# Patient Record
Sex: Female | Born: 2005 | Hispanic: Yes | Marital: Single | Smoking: Never smoker
Health system: Southern US, Community
[De-identification: ages and names within clinical notes are randomized; demographics above are authoritative.]

## PROBLEM LIST (undated history)

## (undated) DIAGNOSIS — F32A Depression, unspecified: Secondary | ICD-10-CM

## (undated) DIAGNOSIS — J45909 Unspecified asthma, uncomplicated: Secondary | ICD-10-CM

## (undated) HISTORY — PX: NO PAST SURGERIES: SHX2092

## (undated) HISTORY — DX: Depression, unspecified: F32.A

---
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
## 2023-01-02 ENCOUNTER — Ambulatory Visit (INDEPENDENT_AMBULATORY_CARE_PROVIDER_SITE_OTHER): Payer: Medicaid Other

## 2023-01-02 ENCOUNTER — Ambulatory Visit (INDEPENDENT_AMBULATORY_CARE_PROVIDER_SITE_OTHER): Payer: Medicaid Other | Admitting: Podiatry

## 2023-01-02 ENCOUNTER — Encounter: Payer: Self-pay | Admitting: Podiatry

## 2023-01-02 DIAGNOSIS — M7752 Other enthesopathy of left foot: Secondary | ICD-10-CM

## 2023-01-02 DIAGNOSIS — Q6689 Other  specified congenital deformities of feet: Secondary | ICD-10-CM

## 2023-01-02 NOTE — Progress Notes (Signed)
  Subjective:  Patient ID: Alexis Wilcox, female    DOB: March 13, 2006,  MRN: [id number] HPI Chief Complaint  Patient presents with   Ankle Pain    Lateral ankle right - aching, sometimes severe pain, burning x few months, no injury, radiates across the top of ankle if on it for awhile, cramping in the ankle and top of foot at night, tried massage cream and Ibuprofen/Tylenol   New Patient (Initial Visit)    17 y.o. female presents with the above complaint.   ROS: Denies fever chills nausea vomit muscle aches pains calf pain back pain chest pain shortness of breath.  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  She states that the foot feels like it is rolling to the outside and is very painful.  She is afraid is going to affect her ability to perform her job duties as well as exercise and assist in her general good health.  Past Medical History:  Diagnosis Date   Asthma    Depression    No past surgical history on file.  Current Outpatient Medications:    cephALEXin (KEFLEX) 500 MG capsule, Take 1 capsule (500 mg total) by mouth 4 (four) times daily for 7 days., Disp: 28 capsule, Rfl: 0  No Known Allergies Review of Systems Objective:  There were no vitals filed for this visit.  General: Well developed, nourished, in no acute distress, alert and oriented x3   Dermatological: Skin is warm, dry and supple bilateral. Nails x 10 are well maintained; remaining integument appears unremarkable at this time. There are no open sores, no preulcerative lesions, no rash or signs of infection present.  Vascular: Dorsalis Pedis artery and Posterior Tibial artery pedal pulses are 2/4 bilateral with immedate capillary fill time. Pedal hair growth present. No varicosities and no lower extremity edema  present bilateral.   Neruologic: Grossly intact via light touch bilateral. Vibratory intact via tuning fork bilateral. Protective threshold with Semmes Wienstein monofilament intact to all pedal sites bilateral. Patellar and Achilles deep tendon reflexes 2+ bilateral. No Babinski or clonus noted bilateral.   Musculoskeletal: No gross boney pedal deformities bilateral.  She has pain on attempted range of motion of the subtalar joint of the left foot she got full ankle joint range of motion and midfoot range of motion.  She has pain on palpation of the sinus tarsi.   Gait: Unassisted, Nonantalgic.    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  Plan: Requesting MRI for differential diagnosis and surgical planning for the correction of this coalition.  It is important to know at this point as to whether or not it is a fibrous coalition cartilaginous coalition or osseous coalition.     Asiana Benninger T. [place], [place]

## 2023-01-08 ENCOUNTER — Other Ambulatory Visit: Payer: Medicaid Other

## 2023-01-09 DIAGNOSIS — Z20822 Contact with and (suspected) exposure to covid-19: Secondary | ICD-10-CM | POA: Diagnosis not present

## 2023-01-09 DIAGNOSIS — A084 Viral intestinal infection, unspecified: Secondary | ICD-10-CM | POA: Diagnosis not present

## 2023-01-16 ENCOUNTER — Telehealth: Payer: Self-pay

## 2023-01-16 NOTE — Telephone Encounter (Signed)
Pt called triage line stating "the hospital told me to call this number". Tried returning patient's call to get more information, LVM for pt to return call

## 2023-01-17 ENCOUNTER — Telehealth: Payer: Self-pay | Admitting: Podiatry

## 2023-01-17 NOTE — Telephone Encounter (Signed)
Patient called, but was difficult to understand on voicemail.    It sounds like she was saying Dr. Al Corpus referred her somewhere else, but she was having issues with her Medicaid at the time.  She is requesting it be sent again.  Did not specify if same place or different facility.    Her callback number:  [phone]

## 2023-01-18 ENCOUNTER — Encounter: Payer: Self-pay | Admitting: Family Medicine

## 2023-01-18 ENCOUNTER — Ambulatory Visit (LOCAL_COMMUNITY_HEALTH_CENTER): Payer: Medicaid Other | Admitting: Family Medicine

## 2023-01-18 VITALS — BP 112/74 | HR 74 | Ht 65.0 in | Wt 199.0 lb

## 2023-01-18 DIAGNOSIS — Z309 Encounter for contraceptive management, unspecified: Secondary | ICD-10-CM

## 2023-01-18 DIAGNOSIS — Z975 Presence of (intrauterine) contraceptive device: Secondary | ICD-10-CM | POA: Diagnosis not present

## 2023-01-18 DIAGNOSIS — N939 Abnormal uterine and vaginal bleeding, unspecified: Secondary | ICD-10-CM

## 2023-01-18 MED ORDER — NORGESTIM-ETH ESTRAD TRIPHASIC 0.18/0.215/0.25 MG-25 MCG PO TABS
1.0000 | ORAL_TABLET | Freq: Every day | ORAL | 2 refills | Status: DC
Start: 2023-01-18 — End: 2023-01-31

## 2023-01-18 NOTE — Progress Notes (Signed)
Pt here for acute visit, condoms given. Gaspar Garbe, RN

## 2023-01-18 NOTE — Progress Notes (Signed)
   [facility] Problem [facility]  [facility]  Subjective:  Alexis Wilcox is a 17 y.o. being seen today for   Chief Complaint  Patient presents with   Contraception    HPI  Nexplanon placed in Feb 2024, since then reports continuous bleeding. States the bleeding stopped for about 2 weeks, and then started back up again. Notices that she feels irritable and has cravings and gained weight (particularly around face) and believes this is due to Nexplanon. I reviewed that we have ways to control the bleeding but adding OCPs may not improve the weight or mood. Patient decides she would still like to try OCPs for now, as not getting pregnant is a priority.   Health Maintenance Due  Topic Date Due   CHLAMYDIA SCREENING  Never done   HPV VACCINES (1 - 3-dose series) Never done   HIV Screening  Never done   Hepatitis C Screening  Never done   COVID-19 Vaccine (1 - 2023-24 season) Never done   DTaP/Tdap/Td (1 - Tdap) Never done   INFLUENZA VACCINE  01/03/2023    Review of Systems  Constitutional:        Weight gain  Psychiatric/Behavioral:         Irritable    The following portions of the patient's history were reviewed and updated as appropriate: allergies, current medications, past family history, past medical history, past social history, past surgical history and problem list. Problem list updated.   See flowsheet for other program required questions.  Objective:   Vitals:   01/18/23 0825  BP: 112/74  Pulse: 74  Weight: 199 lb (90.3 kg)  Height: 5\' 5"  (1.651 m)    Physical Exam  Deferred   Assessment and Plan:  Alexis Wilcox is a 17 y.o. female presenting to the [facility] Department for a Women's Health problem visit  1. Nexplanon in place -desires to keep device  2. Vaginal bleeding -counseled to start OCPs, and take for 3 months -reviewed that if symptoms do not improve, please RTC for  removal of device. Reviewed that there are other options for [facility], including IUD  - Norgestimate-Ethinyl Estradiol Triphasic (TRI-LO-SPRINTEC) 0.18/0.215/0.25 MG-25 MCG tab; Take 1 tablet by mouth daily.  Dispense: 28 tablet; Refill: 2     Return if symptoms worsen or fail to improve.  No future appointments. Total time spent 20 minutes  Lenice Llamas, [place]

## 2023-01-30 ENCOUNTER — Telehealth: Payer: Self-pay | Admitting: Podiatry

## 2023-01-30 NOTE — Telephone Encounter (Signed)
Pt called and was wanting to make sure the referral was sent for pt to have her mri rescheduled.

## 2023-01-31 ENCOUNTER — Ambulatory Visit (INDEPENDENT_AMBULATORY_CARE_PROVIDER_SITE_OTHER): Payer: BC Managed Care – PPO | Admitting: Urology

## 2023-01-31 ENCOUNTER — Encounter: Payer: Self-pay | Admitting: Urology

## 2023-01-31 VITALS — BP 111/79 | HR 86 | Ht 65.0 in | Wt 202.4 lb

## 2023-01-31 DIAGNOSIS — R3129 Other microscopic hematuria: Secondary | ICD-10-CM

## 2023-01-31 DIAGNOSIS — R109 Unspecified abdominal pain: Secondary | ICD-10-CM | POA: Diagnosis not present

## 2023-01-31 DIAGNOSIS — Z87440 Personal history of urinary (tract) infections: Secondary | ICD-10-CM

## 2023-01-31 LAB — URINALYSIS, COMPLETE
Bilirubin, UA: NEGATIVE
Glucose, UA: NEGATIVE
Ketones, UA: NEGATIVE
Leukocytes,UA: NEGATIVE
Nitrite, UA: NEGATIVE
Protein,UA: NEGATIVE
Specific Gravity, UA: 1.025 (ref 1.005–1.030)
Urobilinogen, Ur: 0.2 mg/dL (ref 0.2–1.0)
pH, UA: 6 (ref 5.0–7.5)

## 2023-01-31 LAB — MICROSCOPIC EXAMINATION

## 2023-01-31 NOTE — Progress Notes (Signed)
Marcelle Overlie Plume,acting as a scribe for Vanna Scotland, MD.,have documented all relevant documentation on the behalf of Vanna Scotland, MD,as directed by  Vanna Scotland, MD while in the presence of Vanna Scotland, MD.  01/31/2023 9:34 AM   Marvis Moeller Jolaine Artist 15-Nov-2005 [id number]  Referring provider: No referring provider defined for this encounter.  Chief Complaint  Patient presents with   Establish Care   Hematuria    HPI: 17 year-old female who presents today for follow up of a possible stone event.   She was seen and evaluated in the emergency room on 12/31/2022. At that point, she was having some right sided flank pain intermittently for several weeks along with some chronic intermittent vaginal bleeding. Her urinalysis showed "large blood on dip but none microscopically." She had a few WBC and they treated her for presumed urinary tract infection, although the culture was negative, prescribed Keflex. She had a CT stone protocol on 12/31/2022 that showed no stones, no GU abnormality, but a few prominent mesenteric lymph nodes, especially in the right hemi-abdomen up to 7 mm, possibly consistent with mesenteric adenitis.   Today, she reports that the pain initially started in her lower back and then moved to her abdomen. Despite antibiotic treatment, she continues to experience pain. She also mentions a history of a miscarriage in November, after which she has been prone to frequent infections and UTIs, although recent urine cultures have been negative.  Urinalysis today has 6-10 WBC, 3-10 RBC, many bacteria, but her dip is bland.     PMH: Past Medical History:  Diagnosis Date   Asthma    Depression     Surgical History: Past Surgical History:  Procedure Laterality Date   NO PAST SURGERIES      Home Medications:  Allergies as of 01/31/2023   No Known Allergies      Medication List        Accurate as of January 31, 2023  9:34 AM. If you have any questions,  ask your nurse or doctor.          STOP taking these medications    Norgestimate-Ethinyl Estradiol Triphasic 0.18/0.215/0.25 MG-25 MCG tab Commonly known as: Tri-Lo-Sprintec Stopped by: Vanna Scotland       TAKE these medications    NEXPLANON [place] Inject into the skin.        Family History: Family History  Problem Relation Age of Onset   Diabetes Mother    Diabetes Maternal Grandmother     Social History:  reports that she has never smoked. She has never used smokeless tobacco. She reports current alcohol use. She reports current drug use. Frequency: 4.00 times per week. Drug: Marijuana.   Physical Exam: BP 111/79   Pulse 86   Ht 5\' 5"  (1.651 m)   Wt 202 lb 6 oz (91.8 kg)   LMP 01/11/2023 (Exact Date)   BMI 33.68 kg/m   Constitutional:  Alert and oriented, No acute distress. HEENT: [place] AT, moist mucus membranes.  Trachea midline, no masses. Neurologic: Grossly intact, no focal deficits, moving all 4 extremities. Psychiatric: Normal mood and affect.   Pertinent Imaging:     EXAM: CT ABDOMEN AND PELVIS WITHOUT CONTRAST  TECHNIQUE: Multidetector CT imaging of the abdomen and pelvis was performed following the standard protocol without IV contrast.  RADIATION DOSE REDUCTION: This exam was performed according to the departmental dose-optimization program which includes automated exposure control, adjustment of the mA and/or kV according to patient size and/or  use of iterative reconstruction technique.  COMPARISON:  Abdominal radiograph dated 08/13/2015.  FINDINGS: Lower chest: No acute abnormality.  Hepatobiliary: No focal liver abnormality is seen. No gallstones, gallbladder wall thickening, or biliary dilatation.  Pancreas: Unremarkable. No pancreatic ductal dilatation or surrounding inflammatory changes.  Spleen: Normal in size without focal abnormality.  Adrenals/Urinary Tract: Adrenal glands are unremarkable. Kidneys are normal, without renal  calculi, focal lesion, or hydronephrosis. Bladder is unremarkable.  Stomach/Bowel: Stomach is within normal limits. Appendix appears normal. No evidence of bowel wall thickening, distention, or inflammatory changes.  Vascular/Lymphatic: A few prominent mesenteric lymph nodes in the right hemiabdomen are noted, measuring up to 7 mm in short axis. No significant vascular findings are present.  Reproductive: Uterus and bilateral adnexa are unremarkable.  Other: No abdominal wall hernia or abnormality. No abdominopelvic ascites.  Musculoskeletal: No acute or significant osseous findings.  IMPRESSION: 1. A few prominent mesenteric lymph nodes in the right hemiabdomen are nonspecific but could reflect mesenteric adenitis.   Electronically Signed By: Romona Curls M.D. On: 12/31/2022 16:54  Results for orders placed or performed in visit on 01/31/23  Microscopic Examination   Urine  Result Value Ref Range   WBC, UA 6-10 (A) 0 - 5 /hpf   RBC, Urine 3-10 (A) 0 - 2 /hpf   Epithelial Cells (non renal) 0-10 0 - 10 /hpf   Bacteria, UA Many (A) None seen/Few  Urinalysis, Complete  Result Value Ref Range   Specific Gravity, UA 1.025 1.005 - 1.030   pH, UA 6.0 5.0 - 7.5   Color, UA Yellow Yellow   Appearance Ur Hazy (A) Clear   Leukocytes,UA Negative Negative   Protein,UA Negative Negative/Trace   Glucose, UA Negative Negative   Ketones, UA Negative Negative   RBC, UA 1+ (A) Negative   Bilirubin, UA Negative Negative   Urobilinogen, Ur 0.2 0.2 - 1.0 mg/dL   Nitrite, UA Negative Negative   Microscopic Examination See below:      Assessment & Plan:    1. Abdominal pain - Likely related to mesenteric adenitis as indicated by CT findings; no evidence of GU pathology or stones -Nature of pain is also not consistent with renal origin - Continue to monitor symptoms and follow up with primary care or gastroenterology for further evaluation.  2. Recurrent UTI - Recent urine  cultures have been negative, questioning the diagnosis of recurrent UTIs. - Encourage good hygiene practices: drink plenty of water, urinate after intercourse, and wipe front to back. - Urine sample today will be sent for culture to rule out current infection.  Return if symptoms worsen or fail to improve.  I have reviewed the above documentation for accuracy and completeness, and I agree with the above.   Vanna Scotland, MD    [facility] [address] [place], [place] [address] [phone]

## 2023-02-03 ENCOUNTER — Ambulatory Visit
Admission: RE | Admit: 2023-02-03 | Discharge: 2023-02-03 | Disposition: A | Discharge status: Home / Self Care | Payer: Medicaid Other | Source: Ambulatory Visit | Attending: Podiatry | Admitting: Podiatry

## 2023-02-03 DIAGNOSIS — Q6689 Other  specified congenital deformities of feet: Secondary | ICD-10-CM

## 2023-02-07 LAB — CULTURE, URINE COMPREHENSIVE

## 2023-02-12 ENCOUNTER — Telehealth: Payer: Self-pay | Admitting: Podiatry

## 2023-02-12 NOTE — Telephone Encounter (Signed)
Patient is calling to see if she can get some pain medication until get MRI results.

## 2023-02-15 ENCOUNTER — Other Ambulatory Visit: Payer: Medicaid Other

## 2023-02-25 ENCOUNTER — Telehealth: Payer: Self-pay

## 2023-02-25 NOTE — Telephone Encounter (Signed)
-----   Message from Lottie Rater sent at 02/25/2023  8:38 AM EDT -----  ----- Message ----- From: Elinor Parkinson, DPM Sent: 02/20/2023  12:55 PM EDT To: Kristian Covey, PMAC  Send for CT rear foot to evaluate STJ coalition and how much of the bone is involved.  Left Rear foot. Let patient know why we are doing this.

## 2023-02-25 NOTE — Telephone Encounter (Signed)
Called patient to discuss MRI result and the need for CT scan. Left message to please call back

## 2023-03-01 ENCOUNTER — Other Ambulatory Visit: Payer: Self-pay

## 2023-03-01 DIAGNOSIS — Q6689 Other  specified congenital deformities of feet: Secondary | ICD-10-CM

## 2023-03-04 ENCOUNTER — Encounter: Payer: Self-pay | Admitting: Podiatry

## 2023-03-07 DIAGNOSIS — Z03818 Encounter for observation for suspected exposure to other biological agents ruled out: Secondary | ICD-10-CM | POA: Diagnosis not present

## 2023-03-07 DIAGNOSIS — B9689 Other specified bacterial agents as the cause of diseases classified elsewhere: Secondary | ICD-10-CM | POA: Diagnosis not present

## 2023-03-07 DIAGNOSIS — J019 Acute sinusitis, unspecified: Secondary | ICD-10-CM | POA: Diagnosis not present

## 2023-03-08 ENCOUNTER — Inpatient Hospital Stay: Admission: RE | Admit: 2023-03-08 | Payer: BC Managed Care – PPO | Source: Ambulatory Visit

## 2023-03-11 ENCOUNTER — Other Ambulatory Visit: Payer: Self-pay

## 2023-03-11 ENCOUNTER — Other Ambulatory Visit
Admission: RE | Admit: 2023-03-11 | Discharge: 2023-03-11 | Disposition: A | Discharge status: Home / Self Care | Payer: BC Managed Care – PPO | Source: Ambulatory Visit | Attending: Student | Admitting: Student

## 2023-03-11 ENCOUNTER — Ambulatory Visit: Payer: BC Managed Care – PPO | Admitting: Podiatry

## 2023-03-11 ENCOUNTER — Emergency Department: Payer: BC Managed Care – PPO

## 2023-03-11 ENCOUNTER — Emergency Department
Admission: EM | Admit: 2023-03-11 | Discharge: 2023-03-11 | Disposition: A | Discharge status: Home / Self Care | Payer: BC Managed Care – PPO | Attending: Emergency Medicine | Admitting: Emergency Medicine

## 2023-03-11 DIAGNOSIS — R0789 Other chest pain: Secondary | ICD-10-CM | POA: Insufficient documentation

## 2023-03-11 DIAGNOSIS — R0602 Shortness of breath: Secondary | ICD-10-CM | POA: Diagnosis not present

## 2023-03-11 DIAGNOSIS — R1011 Right upper quadrant pain: Secondary | ICD-10-CM | POA: Diagnosis not present

## 2023-03-11 DIAGNOSIS — J45909 Unspecified asthma, uncomplicated: Secondary | ICD-10-CM | POA: Diagnosis not present

## 2023-03-11 DIAGNOSIS — R112 Nausea with vomiting, unspecified: Secondary | ICD-10-CM | POA: Diagnosis not present

## 2023-03-11 LAB — CBC
HCT: 41 % (ref 36.0–46.0)
Hemoglobin: 13.3 g/dL (ref 12.0–15.0)
MCH: 26.9 pg (ref 26.0–34.0)
MCHC: 32.4 g/dL (ref 30.0–36.0)
MCV: 83 fL (ref 80.0–100.0)
Platelets: 216 10*3/uL (ref 150–400)
RBC: 4.94 MIL/uL (ref 3.87–5.11)
RDW: 12.5 % (ref 11.5–15.5)
WBC: 8.6 10*3/uL (ref 4.0–10.5)
nRBC: 0 % (ref 0.0–0.2)

## 2023-03-11 LAB — D-DIMER, QUANTITATIVE: D-Dimer, Quant: 0.63 ug{FEU}/mL — ABNORMAL HIGH (ref 0.00–0.50)

## 2023-03-11 LAB — BASIC METABOLIC PANEL
Anion gap: 12 (ref 5–15)
BUN: 17 mg/dL (ref 6–20)
CO2: 23 mmol/L (ref 22–32)
Calcium: 9.2 mg/dL (ref 8.9–10.3)
Chloride: 106 mmol/L (ref 98–111)
Creatinine, Ser: 0.61 mg/dL (ref 0.44–1.00)
GFR, Estimated: >60 mL/min (ref >60)
Glucose, Bld: 95 mg/dL (ref 70–99)
Potassium: 4.6 mmol/L (ref 3.5–5.1)
Sodium: 141 mmol/L (ref 135–145)

## 2023-03-11 LAB — TROPONIN I (HIGH SENSITIVITY): Troponin I (High Sensitivity): <2 ng/L (ref <18)

## 2023-03-11 MED ORDER — IOHEXOL 350 MG/ML SOLN
75.0000 mL | Freq: Once | INTRAVENOUS | Status: AC | PRN
  Administered 2023-03-11: 75 mL via INTRAVENOUS

## 2023-03-11 NOTE — ED Triage Notes (Signed)
Pt to ED for chest discomfort and shob for the past few days. Recently sick. Sent from [facility] for elevated d dimer.

## 2023-03-11 NOTE — ED Notes (Signed)
See triage note  Presents with family from [facility]   States she has been having chest discomfort off and on for a while  States the pain increases with inspiration   No fever or cough

## 2023-03-11 NOTE — Discharge Instructions (Signed)
Return to the ER for new, worsening, or persistent severe chest pain difficulty breathing, or any other new or worsening symptoms that concern you.

## 2023-03-11 NOTE — ED Notes (Signed)
States had chest xray PTA

## 2023-03-11 NOTE — ED Provider Notes (Signed)
[facility] Provider Note    Event Date/Time   First MD Initiated Contact with Patient 03/11/23 1641     (approximate)   History   Chest Pain and Shortness of Breath   HPI  Sherle Mello Isolde Skaff is a 17 y.o. female with a history of asthma and depression who presents with chest pain and shortness of breath.  The patient states that the symptoms have been present for some time and she has previously been diagnosed with chest wall pain.  She states that she will get sharp chest pain that moves around to different areas of her chest and sometimes will be associated with shortness of breath.  It is intermittent and has been present for at least a few months.  Last week she started to have cough and congestion and was treated for a respiratory infection with prednisone and an antibiotic.  She states that those symptoms have improved, however now she is having the intermittent chest pain and shortness of breath more severely.  She denies any cough or fever at this time.  She has no leg swelling.  She has no prior history of blood clots.  She is on Nexplanon.  She does not smoke.  I reviewed the past medical records.  The patient's most recent outpatient encounter was on 8/29 with urology for follow-up of ureteral stone.  She was seen at the walk-in clinic today for the current symptoms and D-dimer was elevated.   Physical Exam   Triage Vital Signs: ED Triage Vitals  Encounter Vitals Group     BP 03/11/23 1326 (!) 139/91     Systolic BP Percentile --      Diastolic BP Percentile --      Pulse Rate 03/11/23 1326 86     Resp 03/11/23 1326 18     Temp 03/11/23 1326 98.1 F (36.7 C)     Temp src --      SpO2 03/11/23 1326 99 %     Weight 03/11/23 1318 205 lb (93 kg)     Height 03/11/23 1318 5\' 5"  (1.651 m)     Head Circumference --      Peak Flow --      Pain Score 03/11/23 1318 7     Pain Loc --      Pain Education --      Exclude from Growth Chart --      Most recent vital signs: Vitals:   03/11/23 1732 03/11/23 1935  BP: 130/88 128/82  Pulse: 80 78  Resp: 16 16  Temp: 98 F (36.7 C)   SpO2: 99% 100%     General: Awake, no distress.  CV:  Good peripheral perfusion.  Resp:  Normal effort.  Lungs CTAB. Abd:  No distention.  Other:  No peripheral edema.   ED Results / Procedures / Treatments   Labs (all labs ordered are listed, but only abnormal results are displayed) Labs Reviewed  BASIC METABOLIC PANEL  CBC  POC URINE PREG, ED  TROPONIN I (HIGH SENSITIVITY)     EKG  ED ECG REPORT I, Dionne Bucy, the attending physician, personally viewed and interpreted this ECG.  Date: 03/11/2023 EKG Time: 1322 Rate: 81 Rhythm: normal sinus rhythm QRS Axis: normal Intervals: normal ST/T Wave abnormalities: normal Narrative Interpretation: no evidence of acute ischemia    RADIOLOGY  CT angio chest: I independently viewed and interpreted the images; there are no pulmonary artery occlusions.  Radiology report indicates no evidence  of acute PE.  PROCEDURES:  Critical Care performed: No  Procedures   MEDICATIONS ORDERED IN ED: Medications  iohexol (OMNIPAQUE) 350 MG/ML injection 75 mL (75 mLs Intravenous Contrast Given 03/11/23 1759)     IMPRESSION / MDM / ASSESSMENT AND PLAN / ED COURSE  I reviewed the triage vital signs and the nursing notes.  17 year old female with PMH as noted above presents with intermittent chest pain and shortness of breath which appears to be an exacerbation of chronic symptoms that she has had for at least several months and been worked up for previously.  She had respiratory infection last week which has cleared up, but now she is having an increase in chest pain and shortness of breath.  She was seen at the walk-in clinic today and had an elevated D-dimer so was sent to the ED for further evaluation.  Differential diagnosis includes, but is not limited to, muscular skeletal chest  wall pain, GERD, other benign etiology, PE.  There is no evidence of ACS.  Given that her symptoms are somewhat chronic my clinical suspicion for PE is low but with the elevated D-dimer we will obtain a CT angio for further evaluation.  Patient's presentation is most consistent with acute complicated illness / injury requiring diagnostic workup.  The patient is on the cardiac monitor to evaluate for evidence of arrhythmia and/or significant heart rate changes.  ----------------------------------------- 11:45 PM on 03/11/2023 -----------------------------------------  Labs are unremarkable here with negative troponin and no leukocytosis on the CBC.  CT chest shows no evidence of PE.  The patient is stable for discharge home.  I counseled her on the results of the workup and on return precautions; she expressed understanding.   FINAL CLINICAL IMPRESSION(S) / ED DIAGNOSES   Final diagnoses:  Chest wall pain     Rx / DC Orders   ED Discharge Orders     None        Note:  This document was prepared using Dragon voice recognition software and may include unintentional dictation errors.    Dionne Bucy, MD 03/11/23 [phone]

## 2023-03-11 NOTE — ED Notes (Signed)
Unsuccessful lab attempt. Lab contacted

## 2023-03-14 DIAGNOSIS — Z309 Encounter for contraceptive management, unspecified: Secondary | ICD-10-CM | POA: Diagnosis not present

## 2023-03-14 DIAGNOSIS — E669 Obesity, unspecified: Secondary | ICD-10-CM | POA: Diagnosis not present

## 2023-03-14 DIAGNOSIS — Z1389 Encounter for screening for other disorder: Secondary | ICD-10-CM | POA: Diagnosis not present

## 2023-03-14 DIAGNOSIS — Z1331 Encounter for screening for depression: Secondary | ICD-10-CM | POA: Diagnosis not present

## 2023-03-14 DIAGNOSIS — Z0000 Encounter for general adult medical examination without abnormal findings: Secondary | ICD-10-CM | POA: Diagnosis not present

## 2023-03-14 DIAGNOSIS — F418 Other specified anxiety disorders: Secondary | ICD-10-CM | POA: Diagnosis not present

## 2023-03-14 DIAGNOSIS — M25572 Pain in left ankle and joints of left foot: Secondary | ICD-10-CM | POA: Diagnosis not present

## 2023-03-14 DIAGNOSIS — J4540 Moderate persistent asthma, uncomplicated: Secondary | ICD-10-CM | POA: Diagnosis not present

## 2023-03-18 DIAGNOSIS — M25572 Pain in left ankle and joints of left foot: Secondary | ICD-10-CM | POA: Diagnosis not present

## 2023-03-20 DIAGNOSIS — F39 Unspecified mood [affective] disorder: Secondary | ICD-10-CM | POA: Diagnosis not present

## 2023-04-03 ENCOUNTER — Ambulatory Visit: Payer: BC Managed Care – PPO | Admitting: Podiatry

## 2023-04-25 DIAGNOSIS — R350 Frequency of micturition: Secondary | ICD-10-CM | POA: Diagnosis not present

## 2023-04-25 DIAGNOSIS — J309 Allergic rhinitis, unspecified: Secondary | ICD-10-CM | POA: Diagnosis not present

## 2023-04-25 DIAGNOSIS — N390 Urinary tract infection, site not specified: Secondary | ICD-10-CM | POA: Diagnosis not present

## 2023-05-20 ENCOUNTER — Ambulatory Visit
Admission: RE | Admit: 2023-05-20 | Discharge: 2023-05-20 | Disposition: A | Discharge status: Home / Self Care | Payer: Worker's Comp, Other unspecified | Source: Ambulatory Visit | Attending: Physician Assistant | Admitting: Physician Assistant

## 2023-05-20 ENCOUNTER — Other Ambulatory Visit: Payer: Self-pay | Admitting: Physician Assistant

## 2023-05-20 DIAGNOSIS — R600 Localized edema: Secondary | ICD-10-CM | POA: Diagnosis not present

## 2023-05-20 DIAGNOSIS — Y990 Civilian activity done for income or pay: Secondary | ICD-10-CM

## 2023-05-20 DIAGNOSIS — M25561 Pain in right knee: Secondary | ICD-10-CM | POA: Insufficient documentation

## 2023-05-20 DIAGNOSIS — W19XXXA Unspecified fall, initial encounter: Secondary | ICD-10-CM | POA: Insufficient documentation

## 2023-06-27 ENCOUNTER — Ambulatory Visit: Payer: BC Managed Care – PPO | Admitting: Podiatry

## 2023-07-02 ENCOUNTER — Encounter: Payer: Self-pay | Admitting: Podiatry

## 2023-07-02 ENCOUNTER — Ambulatory Visit (INDEPENDENT_AMBULATORY_CARE_PROVIDER_SITE_OTHER): Payer: BC Managed Care – PPO | Admitting: Podiatry

## 2023-07-02 VITALS — Ht 65.0 in | Wt 205.0 lb

## 2023-07-02 DIAGNOSIS — Z01818 Encounter for other preprocedural examination: Secondary | ICD-10-CM

## 2023-07-02 DIAGNOSIS — Q6689 Other  specified congenital deformities of feet: Secondary | ICD-10-CM | POA: Diagnosis not present

## 2023-07-02 NOTE — Progress Notes (Signed)
Subjective:  Patient ID: Alexis Wilcox, female    DOB: 10/02/2005,  MRN: [id number]  Chief Complaint  Patient presents with   Ankle Pain    Pt is here to f/u on left ankle pain and discuss MRI results, pt states her ankle is still in a lot of pain, sometimes it's hard for her to walk due to the pain.    18 y.o. female presents with the above complaint.  Patient presents with complaint of left middle facet talocalcaneal coalition that has been causing her a lot of pain.  She has been dealing with this for quite some time.  She states that is hard for her to walk due to pain.  She does a lot more standing now and she is noticing more more pain.  She is flat-footed.  She has not seen MRIs prior to seeing me denies any other acute complaint would like to discuss treatment options for it.  She has been treated by Dr. Al Corpus considerably but orthotics and shoe gear modification has not given any relief.  At this time she would like to discuss surgical options.   Review of Systems: Negative except as noted in the HPI. Denies N/V/F/Ch.  Past Medical History:  Diagnosis Date   Asthma    Depression     Current Outpatient Medications:    Etonogestrel (NEXPLANON [place]), Inject into the skin., Disp: , Rfl:   Social History   Tobacco Use  Smoking Status Never  Smokeless Tobacco Never    No Known Allergies Objective:  There were no vitals filed for this visit. Body mass index is 34.11 kg/m. Constitutional Well developed. Well nourished.  Vascular Dorsalis pedis pulses palpable bilaterally. Posterior tibial pulses palpable bilaterally. Capillary refill normal to all digits.  No cyanosis or clubbing noted. Pedal hair growth normal.  Neurologic Normal speech. Oriented to person, place, and time. Epicritic sensation to light touch grossly present bilaterally.  Dermatologic Nails well groomed and normal in appearance. No open wounds. No skin lesions.  Orthopedic: Pain on  palpation to the subtalar joint.  Pain with range of motion of the subtalar joint limited range of motion noted at the subtalar joint.  Pain on the medial side of the subtalar joint at the medial facet.  Minimal pain on the lateral side.  No peroneal spasticity noted.  Pes planovalgus foot structure noted with palpable valgus to many toe signs unable to recreate the arch unable to return calcaneus to neutral position     Radiographs: IMPRESSION: 1. 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 Marked marrow edema about the subtalar and talonavicular joints is likely due to degenerative disease and stress reaction related to the coalition. 2. Negative for tendon or ligament tear. Assessment:   1. Tarsal coalition of left foot    Plan:  Patient was evaluated and treated and all questions answered.  Left subtalar joint middle facet coalition -All questions and concerns were discussed with the patient in extensive detail given the presence of Tomeko lesion in setting of failing all conservative care patient will benefit from excision of the coalition or longer.  I discussed this with patient in extensive detail I discussed my preoperative intra postoperative plan with the patient in extensive detail I will plan on resecting the middle facet coalition however for surgical planning patient will benefit from CT scan to evaluate the extent of the coalition -CT scan was ordered -I will have her scheduled for surgery in March prior to that I  would like to go over the CT scan as well.  Patient is in agreement with the plan and would like to proceed with surgery -Informed surgical risk consent was reviewed and read aloud to the patient.  I reviewed the films.  I have discussed my findings with the patient in great detail.  I have discussed all risks including but not limited to infection, stiffness, scarring, limp, disability, deformity, damage to blood vessels and nerves, numbness, poor  healing, need for braces, arthritis, chronic pain, amputation, death.  All benefits and realistic expectations discussed in great detail.  I have made no promises as to the outcome.  I have provided realistic expectations.  I have offered the patient a 2nd opinion, which they have declined and assured me they preferred to proceed despite the risks   No follow-ups on file.

## 2023-07-03 ENCOUNTER — Encounter: Payer: Self-pay | Admitting: Podiatry

## 2023-07-04 ENCOUNTER — Encounter: Payer: Self-pay | Admitting: Podiatry

## 2023-07-08 ENCOUNTER — Inpatient Hospital Stay: Admission: RE | Admit: 2023-07-08 | Payer: BC Managed Care – PPO | Source: Ambulatory Visit

## 2023-07-15 ENCOUNTER — Emergency Department: Payer: BC Managed Care – PPO

## 2023-07-15 ENCOUNTER — Other Ambulatory Visit: Payer: Self-pay

## 2023-07-15 ENCOUNTER — Emergency Department
Admission: EM | Admit: 2023-07-15 | Discharge: 2023-07-15 | Disposition: A | Discharge status: Home / Self Care | Payer: BC Managed Care – PPO | Attending: Emergency Medicine | Admitting: Emergency Medicine

## 2023-07-15 DIAGNOSIS — B3731 Acute candidiasis of vulva and vagina: Secondary | ICD-10-CM | POA: Diagnosis not present

## 2023-07-15 DIAGNOSIS — B379 Candidiasis, unspecified: Secondary | ICD-10-CM | POA: Insufficient documentation

## 2023-07-15 DIAGNOSIS — R1030 Lower abdominal pain, unspecified: Secondary | ICD-10-CM | POA: Diagnosis not present

## 2023-07-15 DIAGNOSIS — N939 Abnormal uterine and vaginal bleeding, unspecified: Secondary | ICD-10-CM | POA: Insufficient documentation

## 2023-07-15 DIAGNOSIS — R519 Headache, unspecified: Secondary | ICD-10-CM | POA: Diagnosis not present

## 2023-07-15 LAB — URINALYSIS, ROUTINE W REFLEX MICROSCOPIC
Bilirubin Urine: NEGATIVE
Glucose, UA: NEGATIVE mg/dL
Ketones, ur: NEGATIVE mg/dL
Leukocytes,Ua: NEGATIVE
Nitrite: NEGATIVE
Protein, ur: 100 mg/dL — AB
RBC / HPF: >50 RBC/hpf (ref 0–5)
Specific Gravity, Urine: 1.023 (ref 1.005–1.030)
pH: 6 (ref 5.0–8.0)

## 2023-07-15 LAB — WET PREP, GENITAL
Clue Cells Wet Prep HPF POC: NONE SEEN
Sperm: NONE SEEN
Trich, Wet Prep: NONE SEEN
WBC, Wet Prep HPF POC: <10 (ref <10)

## 2023-07-15 LAB — COMPREHENSIVE METABOLIC PANEL
ALT: 15 U/L (ref 0–44)
AST: 14 U/L — ABNORMAL LOW (ref 15–41)
Albumin: 4.4 g/dL (ref 3.5–5.0)
Alkaline Phosphatase: 68 U/L (ref 38–126)
Anion gap: 9 (ref 5–15)
BUN: 13 mg/dL (ref 6–20)
CO2: 23 mmol/L (ref 22–32)
Calcium: 9 mg/dL (ref 8.9–10.3)
Chloride: 107 mmol/L (ref 98–111)
Creatinine, Ser: 0.54 mg/dL (ref 0.44–1.00)
GFR, Estimated: >60 mL/min (ref >60)
Glucose, Bld: 110 mg/dL — ABNORMAL HIGH (ref 70–99)
Potassium: 4.5 mmol/L (ref 3.5–5.1)
Sodium: 139 mmol/L (ref 135–145)
Total Bilirubin: 0.6 mg/dL (ref 0.0–1.2)
Total Protein: 7.3 g/dL (ref 6.5–8.1)

## 2023-07-15 LAB — CHLAMYDIA/NGC RT PCR (ARMC ONLY)
Chlamydia Tr: NOT DETECTED
N gonorrhoeae: NOT DETECTED

## 2023-07-15 LAB — HCG, QUANTITATIVE, PREGNANCY: hCG, Beta Chain, Quant, S: <1 m[IU]/mL (ref <5)

## 2023-07-15 LAB — CBC
HCT: 40.8 % (ref 36.0–46.0)
Hemoglobin: 13.4 g/dL (ref 12.0–15.0)
MCH: 27.7 pg (ref 26.0–34.0)
MCHC: 32.8 g/dL (ref 30.0–36.0)
MCV: 84.5 fL (ref 80.0–100.0)
Platelets: 345 10*3/uL (ref 150–400)
RBC: 4.83 MIL/uL (ref 3.87–5.11)
RDW: 12.7 % (ref 11.5–15.5)
WBC: 8 10*3/uL (ref 4.0–10.5)
nRBC: 0 % (ref 0.0–0.2)

## 2023-07-15 LAB — POC URINE PREG, ED: Preg Test, Ur: NEGATIVE

## 2023-07-15 LAB — LIPASE, BLOOD: Lipase: 33 U/L (ref 11–51)

## 2023-07-15 MED ORDER — FLUCONAZOLE 50 MG PO TABS
150.0000 mg | ORAL_TABLET | Freq: Once | ORAL | Status: AC
  Administered 2023-07-15: 150 mg via ORAL
  Filled 2023-07-15: qty 1

## 2023-07-15 MED ORDER — CEFTRIAXONE SODIUM 1 G IJ SOLR: 500.0000 mg | Freq: Once | INTRAMUSCULAR | Status: DC

## 2023-07-15 MED ORDER — ACETAMINOPHEN 500 MG PO TABS
1000.0000 mg | ORAL_TABLET | Freq: Once | ORAL | Status: AC
  Administered 2023-07-15: 1000 mg via ORAL
  Filled 2023-07-15: qty 2

## 2023-07-15 MED ORDER — KETOROLAC TROMETHAMINE 30 MG/ML IJ SOLN
30.0000 mg | Freq: Once | INTRAMUSCULAR | Status: AC
  Administered 2023-07-15: 30 mg via INTRAMUSCULAR
  Filled 2023-07-15: qty 1

## 2023-07-15 NOTE — ED Provider Notes (Signed)
-----------------------------------------   4:27 PM on 07/15/2023 ----------------------------------------- Patient's workup is reassuring, reassuring labs.  Wet prep is yeast positive, patient received Diflucan  in the emergency department.  Ultrasound shows no concerning findings.  Given the patient's reassuring workup we will discharge with outpatient follow-up.  Patient agreeable plan of care.   Ruth Cove, MD 07/15/23 [phone]

## 2023-07-15 NOTE — ED Provider Notes (Addendum)
 [facility] Provider Note    Event Date/Time   First MD Initiated Contact with Patient 07/15/23 1228     (approximate)   History   Abdominal Pain   HPI  Alexis Wilcox is a 18 y.o. female who comes in with heavy vaginal bleeding with blood clots and lower abdominal pain.  Does report a history of a miscarriage in 2023.  Patient states that she was sexually active with a new partner in December.  She denies any positive pregnancy test at home.  She reports having the vaginal bleeding for a week now.  She denies more than a pad an hour.  She does report significant lower abdominal pain.  She reports some mild intermittent headaches with the bleeding as well but denies any headache at this time.   Physical Exam   Triage Vital Signs: ED Triage Vitals  Encounter Vitals Group     BP 07/15/23 1143 121/76     Systolic BP Percentile --      Diastolic BP Percentile --      Pulse Rate 07/15/23 1143 85     Resp 07/15/23 1143 18     Temp 07/15/23 1143 97.9 F (36.6 C)     Temp Source 07/15/23 1143 Oral     SpO2 07/15/23 1143 98 %     Weight --      Height --      Head Circumference --      Peak Flow --      Pain Score 07/15/23 1142 4     Pain Loc --      Pain Education --      Exclude from Growth Chart --     Most recent vital signs: Vitals:   07/15/23 1143  BP: 121/76  Pulse: 85  Resp: 18  Temp: 97.9 F (36.6 C)  SpO2: 98%     General: Awake, no distress.  CV:  Good peripheral perfusion.  Resp:  Normal effort.  Abd:  No distention.  Other:  CN 2-12 are intact. Equal  strength in arms and legs.  Slight tenderness in the lower abdomen.   ED Results / Procedures / Treatments   Labs (all labs ordered are listed, but only abnormal results are displayed) Labs Reviewed  COMPREHENSIVE METABOLIC PANEL - Abnormal; Notable for the following components:      Result Value   Glucose, Bld 110 (*)    AST 14 (*)    All other components  within normal limits  URINALYSIS, ROUTINE W REFLEX MICROSCOPIC - Abnormal; Notable for the following components:   Color, Urine RED (*)    APPearance HAZY (*)    Hgb urine dipstick LARGE (*)    Protein, ur 100 (*)    Bacteria, UA FEW (*)    All other components within normal limits  LIPASE, BLOOD  CBC  POC URINE PREG, ED      RADIOLOGY Ultrasound reviewed personally interpreted no intrauterine sac   PROCEDURES:  Critical Care performed: No  Procedures   MEDICATIONS ORDERED IN ED: Medications  acetaminophen  (TYLENOL ) tablet 1,000 mg (1,000 mg Oral Given 07/15/23 1301)  ketorolac  (TORADOL ) 30 MG/ML injection 30 mg (30 mg Intramuscular Given 07/15/23 1302)     IMPRESSION / MDM / ASSESSMENT AND PLAN / ED COURSE  I reviewed the triage vital signs and the nursing notes.   Patient's presentation is most consistent with acute presentation with potential threat to life or bodily function.  Differential includes irregular menstruations, ovarian cyst ovarian torsion, pregnancy.  She denies symptoms to suggest PID but I will have her self swab  Preg test negative CMP is reassuring CBC is normal UA with RBCs in it but patient is currently menstruating Patient has a yeast noted hCG was negative   Patient was given a dose of fluconazole  for her yeast infection her STD testing are still pending.  G/c are negative  low suspicion for PID   Considered admission given patient reports passing large clots patient is not anemic and vitals are stable  Pt handed off pending US  read.     FINAL CLINICAL IMPRESSION(S) / ED DIAGNOSES   Final diagnoses:  Vaginal bleeding  Yeast infection     Rx / DC Orders   ED Discharge Orders     None        Note:  This document was prepared using Dragon voice recognition software and may include unintentional dictation errors.   Lubertha Rush, MD 07/15/23 1450    Lubertha Rush, MD 07/15/23 [phone]

## 2023-07-15 NOTE — ED Triage Notes (Signed)
 Pt reports abd pain and HA x2wks. Pt reports did not have her period for 2 months and had a miscarriage. Pt reports symptoms feel similar. Pt reports heavy vaginal bleeding with clots.

## 2023-07-15 NOTE — Discharge Instructions (Addendum)
 Your workup was reassuring you can take Tylenol  1 g every 8 hours with ibuprofen  600 every 8 hours with food to help with pain.  You should call your OB/GYN to make appointment.   Return to the ER for worsening bleeding or any other concerns

## 2023-07-15 NOTE — ED Provider Triage Note (Signed)
 Emergency Medicine Provider Triage Evaluation Note  Alexis Wilcox , a 18 y.o. female  was evaluated in triage.  Pt complains of abdominal pain and headache for 2 weeks. Also reports heavy vaginal bleeding with clots. Describes the pain as being sharp and in the lower abdomen.   Reports history of miscarriage in 2023.   Review of Systems  Positive:  Negative:   Physical Exam  BP 121/76 (BP Location: Left Arm)   Pulse 85   Temp 97.9 F (36.6 C) (Oral)   Resp 18   LMP 07/14/2023   SpO2 98%  Gen:   Awake, no distress   Resp:  Normal effort  MSK:   Moves extremities without difficulty  Other:    Medical Decision Making  Medically screening exam initiated at 11:44 AM.  Appropriate orders placed.  Alexis Wilcox was informed that the remainder of the evaluation will be completed by another provider, this initial triage assessment does not replace that evaluation, and the importance of remaining in the ED until their evaluation is complete.     Alexis Breen, PA-C 07/15/23 1146

## 2023-07-15 NOTE — ED Notes (Signed)
 See triage notes. Patient c/o abdominal cramping and headaches times two weeks. Patient stated she did not have her period for two months

## 2023-07-16 ENCOUNTER — Telehealth: Payer: Self-pay | Admitting: Podiatry

## 2023-07-16 NOTE — Telephone Encounter (Signed)
DOS-08/19/23  [employer] CALCANEAL COALITION RESECTION [id number]  [employer] EFFECTIVE DATE- 05/04/21  PER THE [employer] PORTAL,PRIOR AUTH IS NOT REQUIRED FOR CPT CODE 66440. [phone] No, prior authorization is not required if the service is performed in an outpatient setting, you are a participating provider and this is on your provider contract. Member plan and benefit limits may apply."   [employer] EFFECTIVE DATE- 10/15/22  SPOKE WITH CHRISTINE D FROM [employer] AND SHE STATED THAT PRIOR AUTH IS NOT REQUIRED FOR CPT CODE 59563.  CALL REF Charline Bills - [id number]

## 2023-07-23 ENCOUNTER — Ambulatory Visit
Admission: RE | Admit: 2023-07-23 | Discharge: 2023-07-23 | Disposition: A | Discharge status: Home / Self Care | Payer: BC Managed Care – PPO | Source: Ambulatory Visit | Attending: Podiatry | Admitting: Podiatry

## 2023-07-23 DIAGNOSIS — Q6689 Other  specified congenital deformities of feet: Secondary | ICD-10-CM | POA: Diagnosis not present

## 2023-08-12 ENCOUNTER — Encounter: Payer: Self-pay | Admitting: *Deleted

## 2023-08-12 ENCOUNTER — Other Ambulatory Visit: Payer: Self-pay

## 2023-08-12 DIAGNOSIS — Z5321 Procedure and treatment not carried out due to patient leaving prior to being seen by health care provider: Secondary | ICD-10-CM | POA: Diagnosis not present

## 2023-08-12 DIAGNOSIS — R109 Unspecified abdominal pain: Secondary | ICD-10-CM | POA: Insufficient documentation

## 2023-08-12 DIAGNOSIS — R300 Dysuria: Secondary | ICD-10-CM | POA: Insufficient documentation

## 2023-08-12 LAB — CBC
HCT: 39.9 % (ref 36.0–46.0)
Hemoglobin: 13.1 g/dL (ref 12.0–15.0)
MCH: 28 pg (ref 26.0–34.0)
MCHC: 32.8 g/dL (ref 30.0–36.0)
MCV: 85.3 fL (ref 80.0–100.0)
Platelets: 365 10*3/uL (ref 150–400)
RBC: 4.68 MIL/uL (ref 3.87–5.11)
RDW: 12.4 % (ref 11.5–15.5)
WBC: 10.5 10*3/uL (ref 4.0–10.5)
nRBC: 0 % (ref 0.0–0.2)

## 2023-08-12 LAB — COMPREHENSIVE METABOLIC PANEL
ALT: 15 U/L (ref 0–44)
AST: 16 U/L (ref 15–41)
Albumin: 4.6 g/dL (ref 3.5–5.0)
Alkaline Phosphatase: 68 U/L (ref 38–126)
Anion gap: 11 (ref 5–15)
BUN: 17 mg/dL (ref 6–20)
CO2: 25 mmol/L (ref 22–32)
Calcium: 9.6 mg/dL (ref 8.9–10.3)
Chloride: 106 mmol/L (ref 98–111)
Creatinine, Ser: 0.59 mg/dL (ref 0.44–1.00)
GFR, Estimated: >60 mL/min (ref >60)
Glucose, Bld: 93 mg/dL (ref 70–99)
Potassium: 3.5 mmol/L (ref 3.5–5.1)
Sodium: 142 mmol/L (ref 135–145)
Total Bilirubin: 0.7 mg/dL (ref 0.0–1.2)
Total Protein: 7.7 g/dL (ref 6.5–8.1)

## 2023-08-12 LAB — URINALYSIS, ROUTINE W REFLEX MICROSCOPIC
Bilirubin Urine: NEGATIVE
Glucose, UA: NEGATIVE mg/dL
Hgb urine dipstick: NEGATIVE
Ketones, ur: NEGATIVE mg/dL
Leukocytes,Ua: NEGATIVE
Nitrite: NEGATIVE
Protein, ur: NEGATIVE mg/dL
Specific Gravity, Urine: 1.024 (ref 1.005–1.030)
pH: 5 (ref 5.0–8.0)

## 2023-08-12 LAB — LIPASE, BLOOD: Lipase: 87 U/L — ABNORMAL HIGH (ref 11–51)

## 2023-08-12 LAB — POC URINE PREG, ED: Preg Test, Ur: NEGATIVE

## 2023-08-12 NOTE — ED Notes (Signed)
 Poct pregnancy Negative

## 2023-08-12 NOTE — Telephone Encounter (Signed)
 SPOKE WITH ANDREA G. WITH [employer] AND SHE STATED THAT FOR CPT CODE 78295 NO PRIOR AUTH IS REQUIRED.   CALL REF # I - [id number]

## 2023-08-12 NOTE — ED Triage Notes (Signed)
 Pt reports abd pain for several months.  Pt denies v/d.  Pt has lower back pain and intermittent dysuria.  Pt alert.

## 2023-08-12 NOTE — ED Provider Triage Note (Signed)
 Emergency Medicine Provider Triage Evaluation Note  Errika Narvaiz , a 18 y.o. female  was evaluated in triage.  Pt complains of intermitten abd pain x several months. No N/V/D. Intermittent dysuria.  Review of Systems  Positive: Abd pain Negative: N/v/d  Physical Exam  Ht 5\' 5"  (1.651 m)   Wt 90.7 kg   LMP 07/22/2023 (Approximate)   BMI 33.28 kg/m  Gen:   Awake, no distress   Resp:  Normal effort  MSK:   Moves extremities without difficulty  Other:    Medical Decision Making  Medically screening exam initiated at 7:39 PM.  Appropriate orders placed.  Nahia Nissan was informed that the remainder of the evaluation will be completed by another provider, this initial triage assessment does not replace that evaluation, and the importance of remaining in the ED until their evaluation is complete.  Basic labs, UA, preg test ordered   Racheal Patches, PA-C 08/12/23 1939

## 2023-08-13 ENCOUNTER — Emergency Department
Admission: EM | Admit: 2023-08-13 | Discharge: 2023-08-13 | Discharge status: Against Medical Advice | Attending: Emergency Medicine | Admitting: Emergency Medicine

## 2023-08-13 ENCOUNTER — Telehealth: Payer: Self-pay | Admitting: Emergency Medicine

## 2023-08-13 DIAGNOSIS — R300 Dysuria: Secondary | ICD-10-CM | POA: Diagnosis not present

## 2023-08-13 DIAGNOSIS — Z1389 Encounter for screening for other disorder: Secondary | ICD-10-CM | POA: Diagnosis not present

## 2023-08-13 DIAGNOSIS — F39 Unspecified mood [affective] disorder: Secondary | ICD-10-CM | POA: Diagnosis not present

## 2023-08-13 DIAGNOSIS — R102 Pelvic and perineal pain: Secondary | ICD-10-CM | POA: Diagnosis not present

## 2023-08-13 NOTE — ED Notes (Signed)
 No answer when called several times from lobby

## 2023-08-13 NOTE — Telephone Encounter (Signed)
 Called patient due to left emergency department before provider exam to inquire about condition and follow up plans. She says she does have appt with pcp. I told her to make sure they look at the labs that were done in triage.  She will tell them.

## 2023-08-19 ENCOUNTER — Other Ambulatory Visit: Payer: Self-pay | Admitting: Podiatry

## 2023-08-19 ENCOUNTER — Telehealth: Payer: Self-pay | Admitting: Podiatry

## 2023-08-19 DIAGNOSIS — Q6689 Other  specified congenital deformities of feet: Secondary | ICD-10-CM | POA: Diagnosis not present

## 2023-08-19 HISTORY — PX: ANKLE SURGERY: SHX546

## 2023-08-19 MED ORDER — IBUPROFEN 800 MG PO TABS: 800.0000 mg | ORAL_TABLET | Freq: Four times a day (QID) | ORAL | 1 refills | Status: DC | PRN

## 2023-08-19 MED ORDER — OXYCODONE-ACETAMINOPHEN 5-325 MG PO TABS: 1.0000 | ORAL_TABLET | ORAL | 0 refills | Status: DC | PRN

## 2023-08-19 NOTE — Telephone Encounter (Signed)
 Called pt at [phone] and left mess on VM. I left my fax# for FMLA forms to be faxed to me as well as my direct line to contact me. See prev notes she had surgery today

## 2023-08-27 ENCOUNTER — Ambulatory Visit (INDEPENDENT_AMBULATORY_CARE_PROVIDER_SITE_OTHER): Payer: BC Managed Care – PPO | Admitting: Podiatry

## 2023-08-27 ENCOUNTER — Ambulatory Visit (INDEPENDENT_AMBULATORY_CARE_PROVIDER_SITE_OTHER)

## 2023-08-27 DIAGNOSIS — Q6689 Other  specified congenital deformities of feet: Secondary | ICD-10-CM

## 2023-08-27 DIAGNOSIS — Z9889 Other specified postprocedural states: Secondary | ICD-10-CM

## 2023-08-27 MED ORDER — OXYCODONE-ACETAMINOPHEN 5-325 MG PO TABS: 1.0000 | ORAL_TABLET | ORAL | 0 refills | Status: DC | PRN

## 2023-08-27 NOTE — Progress Notes (Signed)
  Subjective:  Patient ID: Alexis Wilcox, female    DOB: July 20, 2005,  MRN: [id number]  No chief complaint on file.   DOS: 08/19/2023 Procedure: Left talocalcaneal coalition resection  18 y.o. female returns for post-op check.  Patient states that she is doing well.  Denies any other acute complaints.  Pain is controlled.  She has been taking her pain medication.  Review of Systems: Negative except as noted in the HPI. Denies N/V/F/Ch.  Past Medical History:  Diagnosis Date   Asthma    Depression     Current Outpatient Medications:    oxyCODONE-acetaminophen (PERCOCET) 5-325 MG tablet, Take 1 tablet by mouth every 4 (four) hours as needed for severe pain (pain score 7-10)., Disp: 30 tablet, Rfl: 0   Etonogestrel (NEXPLANON [place]), Inject into the skin., Disp: , Rfl:    ibuprofen (ADVIL) 800 MG tablet, Take 1 tablet (800 mg total) by mouth every 6 (six) hours as needed., Disp: 60 tablet, Rfl: 1   oxyCODONE-acetaminophen (PERCOCET) 5-325 MG tablet, Take 1 tablet by mouth every 4 (four) hours as needed for severe pain (pain score 7-10)., Disp: 30 tablet, Rfl: 0  Social History   Tobacco Use  Smoking Status Never  Smokeless Tobacco Never    No Known Allergies Objective:  There were no vitals filed for this visit. There is no height or weight on file to calculate BMI. Constitutional Well developed. Well nourished.  Vascular Foot warm and well perfused. Capillary refill normal to all digits.   Neurologic Normal speech. Oriented to person, place, and time. Epicritic sensation to light touch grossly present bilaterally.  Dermatologic Skin healing well without signs of infection. Skin edges well coapted without signs of infection.  Orthopedic: Tenderness to palpation noted about the surgical site.   Radiographs: 3 views of skeletally mature adult left foot: Status post tarsal coalition resection noted. Assessment:   1. Tarsal coalition of left foot   2. S/P foot surgery     Plan:  Patient was evaluated and treated and all questions answered.  S/p foot surgery left -Progressing as expected post-operatively. -XR: None -WB Status: Nonweightbearing in left lower extremity -Sutures: Intact.  No clinic signs of dehiscence noted no complication noted. -Medications: None -Foot redressed.  No follow-ups on file.

## 2023-08-28 ENCOUNTER — Telehealth: Payer: Self-pay | Admitting: Podiatry

## 2023-08-28 NOTE — Telephone Encounter (Signed)
 Waiting on forms

## 2023-09-05 ENCOUNTER — Telehealth: Payer: Self-pay | Admitting: Podiatry

## 2023-09-05 NOTE — Telephone Encounter (Signed)
 Still waiting on forms form pt's employer. See prev notes

## 2023-09-10 ENCOUNTER — Encounter: Payer: Self-pay | Admitting: Podiatry

## 2023-09-10 ENCOUNTER — Telehealth: Payer: Self-pay | Admitting: Podiatry

## 2023-09-10 ENCOUNTER — Ambulatory Visit (INDEPENDENT_AMBULATORY_CARE_PROVIDER_SITE_OTHER): Payer: BC Managed Care – PPO | Admitting: Podiatry

## 2023-09-10 DIAGNOSIS — Z9889 Other specified postprocedural states: Secondary | ICD-10-CM

## 2023-09-10 DIAGNOSIS — Q6689 Other  specified congenital deformities of feet: Secondary | ICD-10-CM

## 2023-09-10 NOTE — Telephone Encounter (Signed)
 See notes. Pt will have faxed or emailed to me.

## 2023-09-10 NOTE — Progress Notes (Signed)
  Subjective:  Patient ID: Alexis Wilcox, female    DOB: 2006/01/12,  MRN: [id number]  Chief Complaint  Patient presents with   Routine Post Op    DOS 08/19/23, LEFT TALOCALCANEAL COALITION RESECTION WITH INTERPOSITIONAL GRAFT    DOS: 08/19/2023 Procedure: Left talocalcaneal coalition resection  18 y.o. female returns for post-op check.  Patient states that she is doing well.  Denies any other acute complaints.  Pain is controlled.  She has been taking her pain medication.  Review of Systems: Negative except as noted in the HPI. Denies N/V/F/Ch.  Past Medical History:  Diagnosis Date   Asthma    Depression     Current Outpatient Medications:    nitrofurantoin, macrocrystal-monohydrate, (MACROBID) 100 MG capsule, Take 100 mg by mouth 2 (two) times daily., Disp: , Rfl:    Vitamin D, Ergocalciferol, (DRISDOL) 1.25 MG (50000 UNIT) CAPS capsule, Take 50,000 Units by mouth once a week., Disp: , Rfl:    Etonogestrel (NEXPLANON [place]), Inject into the skin., Disp: , Rfl:    ibuprofen (ADVIL) 800 MG tablet, Take 1 tablet (800 mg total) by mouth every 6 (six) hours as needed., Disp: 60 tablet, Rfl: 1   oxyCODONE-acetaminophen (PERCOCET) 5-325 MG tablet, Take 1 tablet by mouth every 4 (four) hours as needed for severe pain (pain score 7-10)., Disp: 30 tablet, Rfl: 0   oxyCODONE-acetaminophen (PERCOCET) 5-325 MG tablet, Take 1 tablet by mouth every 4 (four) hours as needed for severe pain (pain score 7-10)., Disp: 30 tablet, Rfl: 0  Social History   Tobacco Use  Smoking Status Never  Smokeless Tobacco Never    No Known Allergies Objective:  There were no vitals filed for this visit. There is no height or weight on file to calculate BMI. Constitutional Well developed. Well nourished.  Vascular Foot warm and well perfused. Capillary refill normal to all digits.   Neurologic Normal speech. Oriented to person, place, and time. Epicritic sensation to light touch grossly present  bilaterally.  Dermatologic Skin completely reepithelialized no signs of Deis is noted no complication noted.  Orthopedic: Tenderness to palpation noted about the surgical site.   Radiographs: 3 views of skeletally mature adult left foot: Status post tarsal coalition resection noted. Assessment:   1. Tarsal coalition of left foot   2. S/P foot surgery     Plan:  Patient was evaluated and treated and all questions answered.  S/p foot surgery left -Progressing as expected post-operatively. -XR: None -WB Status: Weightbearing as tolerated in cam boot in 2 weeks transition to regular shoes -Sutures: Removed no clinic signs of dehiscence noted no complication noted. -Medications: None - Clinically improving.  I will see her back in 4 weeks for final follow-up.  At this time we will defer physical therapy as she is ambulating without acute complaints  No follow-ups on file.

## 2023-09-13 ENCOUNTER — Telehealth: Payer: Self-pay | Admitting: Podiatry

## 2023-09-13 DIAGNOSIS — Z0271 Encounter for disability determination: Secondary | ICD-10-CM

## 2023-09-13 NOTE — Telephone Encounter (Signed)
 S/w pt she gave ok to email her supervisor back the completed forms/notes. I faxed [employer] [phone]. adv her RTW date 11/01/23. She advised her job- she is on her feet all shift. Adv her she can go back with restrictions if need be. TBD   See prev notes her supervisor had the wrong email for me. He bought the forms to office and he emailed me as well.

## 2023-09-19 ENCOUNTER — Other Ambulatory Visit: Payer: Self-pay | Admitting: Podiatry

## 2023-10-22 ENCOUNTER — Ambulatory Visit (INDEPENDENT_AMBULATORY_CARE_PROVIDER_SITE_OTHER): Admitting: Podiatry

## 2023-10-22 ENCOUNTER — Ambulatory Visit (INDEPENDENT_AMBULATORY_CARE_PROVIDER_SITE_OTHER)

## 2023-10-22 DIAGNOSIS — Q6689 Other  specified congenital deformities of feet: Secondary | ICD-10-CM

## 2023-10-22 DIAGNOSIS — Q666 Other congenital valgus deformities of feet: Secondary | ICD-10-CM | POA: Diagnosis not present

## 2023-10-22 NOTE — Progress Notes (Signed)
  Subjective:  Patient ID: Alexis Wilcox, female    DOB: [birth year],  MRN: [id number]  Chief Complaint  Patient presents with   Routine Post Op    DOS 08/19/23, LEFT TALOCALCANEAL COALITION RESECTION WITH INTERPOSITIONAL GRAFT    DOS: 08/19/2023 Procedure: Left talocalcaneal coalition resection  18 y.o. female returns for post-op check.  Patient states that she is doing well.  Denies any other acute complaints.  Pain is controlled.  She has been taking her pain medication.  Review of Systems: Negative except as noted in the HPI. Denies N/V/F/Ch.  Past Medical History:  Diagnosis Date   Asthma    Depression     Current Outpatient Medications:    Etonogestrel  (NEXPLANON  [place]), Inject into the skin., Disp: , Rfl:    ibuprofen  (ADVIL ) 800 MG tablet, Take 1 tablet (800 mg total) by mouth every 6 (six) hours as needed., Disp: 60 tablet, Rfl: 1   nitrofurantoin, macrocrystal-monohydrate, (MACROBID) 100 MG capsule, Take 100 mg by mouth 2 (two) times daily., Disp: , Rfl:    oxyCODONE -acetaminophen  (PERCOCET) 5-325 MG tablet, Take 1 tablet by mouth every 4 (four) hours as needed for severe pain (pain score 7-10)., Disp: 30 tablet, Rfl: 0   oxyCODONE -acetaminophen  (PERCOCET) 5-325 MG tablet, Take 1 tablet by mouth every 4 (four) hours as needed for severe pain (pain score 7-10)., Disp: 30 tablet, Rfl: 0   Vitamin D, Ergocalciferol, (DRISDOL) 1.25 MG (50000 UNIT) CAPS capsule, Take 50,000 Units by mouth once a week., Disp: , Rfl:   Social History   Tobacco Use  Smoking Status Never  Smokeless Tobacco Never    No Known Allergies Objective:  There were no vitals filed for this visit. There is no height or weight on file to calculate BMI. Constitutional Well developed. Well nourished.  Vascular Foot warm and well perfused. Capillary refill normal to all digits.   Neurologic Normal speech. Oriented to person, place, and time. Epicritic sensation to light touch grossly present  bilaterally.  Dermatologic Skin completely reepithelialized no signs of Deis is noted no complication noted.  Good range of motion of the subtalar joint noted no pain with range of motion of the subtalar joint  Orthopedic: Mild tenderness to palpation noted about the surgical site.   Radiographs: 3 views of skeletally mature adult left foot: Status post tarsal coalition resection noted. Assessment:   1. Tarsal coalition of left foot     Plan:  Patient was evaluated and treated and all questions answered.  S/p foot surgery left - Clinically she is officially healed.  She is experiencing normal healing pains.  She can return to work on light duty for now.  If she has too much pain she will need to take a little bit more time off. - I discussed with her shoe gear modification in extensive detail she will also benefit from orthotics which we will get started on today   Pes planovalgus -I explained to patient the etiology of pes planovalgus and relationship with Planter fasciitis and various treatment options were discussed.  Given patient foot structure in the setting of Planter fasciitis I believe patient will benefit from custom-made orthotics to help control the hindfoot motion support the arch of the foot and take the stress away from plantar fascial.  Patient agrees with the plan like to proceed with orthotics -Patient was casted for orthotics   No follow-ups on file.

## 2023-10-23 NOTE — Telephone Encounter (Signed)
 pt lft mess on my vmail about extending RTW. I cld her bk to advise would revise to 01/02/24 because she is geting orthotics and will let me know if she can go back sooner.

## 2023-10-29 DIAGNOSIS — Z0271 Encounter for disability determination: Secondary | ICD-10-CM

## 2023-10-29 NOTE — Telephone Encounter (Signed)
 Faxed [employer] [phone] form/notes with revised RTW date 01/02/24

## 2023-10-31 DIAGNOSIS — F1220 Cannabis dependence, uncomplicated: Secondary | ICD-10-CM | POA: Diagnosis not present

## 2023-11-04 DIAGNOSIS — Z7251 High risk heterosexual behavior: Secondary | ICD-10-CM | POA: Diagnosis not present

## 2023-11-04 DIAGNOSIS — J039 Acute tonsillitis, unspecified: Secondary | ICD-10-CM | POA: Diagnosis not present

## 2023-11-04 DIAGNOSIS — R070 Pain in throat: Secondary | ICD-10-CM | POA: Diagnosis not present

## 2023-11-22 ENCOUNTER — Telehealth: Payer: Self-pay

## 2023-11-22 NOTE — Telephone Encounter (Signed)
 LVM to schedule orthotic PU

## 2023-11-27 NOTE — Telephone Encounter (Signed)
 Orthotics placed in [place] box on 11/27/23

## 2023-12-03 ENCOUNTER — Ambulatory Visit (INDEPENDENT_AMBULATORY_CARE_PROVIDER_SITE_OTHER): Admitting: Podiatry

## 2023-12-03 DIAGNOSIS — Q666 Other congenital valgus deformities of feet: Secondary | ICD-10-CM

## 2023-12-03 NOTE — Progress Notes (Signed)
 Orthotics were dispensed are functioning well and no acute complaints

## 2023-12-23 ENCOUNTER — Encounter: Payer: Self-pay | Admitting: Podiatry

## 2023-12-25 ENCOUNTER — Encounter: Payer: Self-pay | Admitting: Podiatry

## 2023-12-25 NOTE — Telephone Encounter (Signed)
 pt lft mess on my vmail and I called her back. she is still having pain and wants to extend leave. I advise would send revision to [facility] [phone] for RTW 03/09/24

## 2024-01-01 NOTE — Telephone Encounter (Signed)
 See MyChart mess. I adv pt to give my name and fax# if a form need to be completed for her extended time request.

## 2024-01-06 DIAGNOSIS — Z0271 Encounter for disability determination: Secondary | ICD-10-CM

## 2024-01-06 NOTE — Telephone Encounter (Signed)
 Recd [place] forms to updated extended time 03/09/24.Faxed [phone]

## 2024-01-13 DIAGNOSIS — K5900 Constipation, unspecified: Secondary | ICD-10-CM | POA: Diagnosis not present

## 2024-01-13 DIAGNOSIS — R102 Pelvic and perineal pain: Secondary | ICD-10-CM | POA: Diagnosis not present

## 2024-01-13 DIAGNOSIS — Z1389 Encounter for screening for other disorder: Secondary | ICD-10-CM | POA: Diagnosis not present

## 2024-01-21 NOTE — Telephone Encounter (Signed)
 Per MyChart mess, pt said I was suppose to get forms again via email. Nothing has been recd. I confirmed email addr and fax and that I would refax what was sent on 01/06/24 to [facility]. See prev notes

## 2024-01-22 ENCOUNTER — Telehealth: Payer: Self-pay | Admitting: Podiatry

## 2024-01-22 DIAGNOSIS — Z0271 Encounter for disability determination: Secondary | ICD-10-CM

## 2024-01-22 NOTE — Telephone Encounter (Signed)
 Pt sent new form from [facility] via email. Faxed [phone] form/note

## 2024-01-22 NOTE — Telephone Encounter (Signed)
 Faxed [employer] [phone] forms pt emailed on yesterday. I adv her via MyChart they were faxed this morning and I will email her copy for her records.

## 2024-02-04 ENCOUNTER — Ambulatory Visit (INDEPENDENT_AMBULATORY_CARE_PROVIDER_SITE_OTHER): Admitting: Physician Assistant

## 2024-02-04 VITALS — BP 118/82 | HR 92 | Ht 65.0 in | Wt 202.0 lb

## 2024-02-04 DIAGNOSIS — R109 Unspecified abdominal pain: Secondary | ICD-10-CM

## 2024-02-04 DIAGNOSIS — R102 Pelvic and perineal pain: Secondary | ICD-10-CM

## 2024-02-04 LAB — URINALYSIS, COMPLETE
Bilirubin, UA: NEGATIVE
Glucose, UA: NEGATIVE
Ketones, UA: NEGATIVE
Nitrite, UA: NEGATIVE
Protein,UA: NEGATIVE
RBC, UA: NEGATIVE
Specific Gravity, UA: 1.015 (ref 1.005–1.030)
Urobilinogen, Ur: 0.2 mg/dL (ref 0.2–1.0)
pH, UA: 6.5 (ref 5.0–7.5)

## 2024-02-04 LAB — MICROSCOPIC EXAMINATION: Epithelial Cells (non renal): >10 /HPF — AB (ref 0–10)

## 2024-02-04 NOTE — Patient Instructions (Signed)
 Ultrasound scheduling: [phone]

## 2024-02-04 NOTE — Progress Notes (Unsigned)
 02/04/2024 3:44 PM   Alexis Wilcox 08/20/2005 [id number]  CC: Chief Complaint  Patient presents with   New Patient (Initial Visit)   Establish Care   Pelvic Pain   HPI: Alexis Wilcox is a 18 y.o. 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   Today she reports about 2 years of chronic crampy and stabbing pelvic pain, urgency, hesitancy, and dyspareunia that started after a spontaneous abortion.  She denies dysuria and her symptoms do not improve with voiding.  She also describes bilateral pain on her sides at the mid axillary line.  She is now on Nexplanon  for birth control from [facility].  She denies any possible STI exposures, and states she was recently tested for these and negative.  In-office UA today positive for 1+ leukocytes; urine microscopy with 11-30 WBCs/HPF, >10 epithelial cells/hpf, and many bacteria.   PMH: Past Medical History:  Diagnosis Date   Asthma    Depression     Surgical History: Past Surgical History:  Procedure Laterality Date   NO PAST SURGERIES      Home Medications:  Allergies as of 02/04/2024   No Known Allergies      Medication List        Accurate as of February 04, 2024  3:44 PM. If you have any questions, ask your nurse or doctor.          STOP taking these medications    ibuprofen  800 MG tablet Commonly known as: ADVIL  Stopped by: Alaisha Eversley   nitrofurantoin (macrocrystal-monohydrate) 100 MG capsule Commonly known as: MACROBID Stopped by: Delpha Perko   oxyCODONE -acetaminophen  5-325 MG tablet Commonly known as: Percocet Stopped by: Quadarius Henton   Vitamin D (Ergocalciferol) 1.25 MG (50000 UNIT) Caps capsule Commonly known as: DRISDOL Stopped by: Lucie Hones       TAKE these medications    NEXPLANON  [place] Inject into the skin.        Allergies:  No Known  Allergies  Family History: Family History  Problem Relation Age of Onset   Diabetes Mother    Diabetes Maternal Grandmother     Social History:   reports that she has never smoked. She has never used smokeless tobacco. She reports current alcohol use. She reports current drug use. Frequency: 4.00 times per week. Drug: Marijuana.  Physical Exam: BP 118/82 (BP Location: Left Arm, Patient Position: Sitting, Cuff Size: Normal)   Pulse 92   Ht 5' 5 (1.651 m)   Wt 202 lb (91.6 kg)   SpO2 96%   BMI 33.61 kg/m   Constitutional:  Alert and oriented, no acute distress, nontoxic appearing HEENT: [place], AT Cardiovascular: No clubbing, cyanosis, or edema Respiratory: Normal respiratory effort, no increased work of breathing Skin: No rashes, bruises or suspicious lesions Neurologic: Grossly intact, no focal deficits, moving all 4 extremities Psychiatric: Normal mood and affect  Laboratory Data: Results for orders placed or performed in visit on 02/04/24  Microscopic Examination   Collection Time: 02/04/24  3:14 PM   Urine  Result Value Ref Range   WBC, UA 11-30 (A) 0 - 5 /hpf   RBC, Urine 0-2 0 - 2 /hpf   Epithelial Cells (non renal) >10 (A) 0 - 10 /hpf   Crystals Present (A) N/A   Crystal Type Amorphous Sediment N/A   Mucus, UA Present (A) Not Estab.   Bacteria, UA Many (A) None seen/Few  Urinalysis,  Complete   Collection Time: 02/04/24  3:14 PM  Result Value Ref Range   Specific Gravity, UA 1.015 1.005 - 1.030   pH, UA 6.5 5.0 - 7.5   Color, UA Yellow Yellow   Appearance Ur Slightly cloudy Clear   Leukocytes,UA 1+ (A) Negative   Protein,UA Negative Negative/Trace   Glucose, UA Negative Negative   Ketones, UA Negative Negative   RBC, UA Negative Negative   Bilirubin, UA Negative Negative   Urobilinogen, Ur 0.2 0.2 - 1.0 mg/dL   Nitrite, UA Negative Negative   Microscopic Examination See below:   Mycoplasma / ureaplasma culture   Collection Time: 02/04/24  4:10 PM    Specimen: Genital   UR  Result Value Ref Range   Ureaplasma urealyticum Comment Negative   Mycoplasma hominis Culture Comment Negative   Assessment & Plan:   1. Pelvic pain in female (Primary) 2 years of chronic pelvic pain, voiding symptoms, and dyspareunia after a spontaneous abortion.  Question possible chronic endometritis.  I am referring to her to OB/GYN for further evaluation of this.  UA today appears contaminated, low suspicion for UTI given her duration of symptoms, though will send for atypical culture today and treat as indicated. - Urinalysis, Complete - Mycoplasma / ureaplasma culture - Ambulatory referral to Obstetrics / Gynecology  2. Side pain I do not think her side pain is related to her pelvic pain.  Anticipate most likely musculoskeletal in origin, though will obtain renal ultrasound to rule out underlying hydronephrosis.  We discussed that the location of her pain is rather atypical for renal colic. - US  RENAL; Future   Return for Will call with results.  Lucie Hones, PA-C  [facility] [place] [address] [place], [place] [address] [phone]

## 2024-02-10 ENCOUNTER — Ambulatory Visit: Payer: Self-pay | Admitting: Physician Assistant

## 2024-02-10 DIAGNOSIS — R102 Pelvic and perineal pain: Secondary | ICD-10-CM

## 2024-02-10 MED ORDER — DOXYCYCLINE HYCLATE 100 MG PO CAPS
100.0000 mg | ORAL_CAPSULE | Freq: Two times a day (BID) | ORAL | 0 refills | Status: AC
Start: 2024-02-10 — End: 2024-02-17

## 2024-02-11 ENCOUNTER — Encounter: Payer: Self-pay | Admitting: Obstetrics & Gynecology

## 2024-02-11 ENCOUNTER — Ambulatory Visit (INDEPENDENT_AMBULATORY_CARE_PROVIDER_SITE_OTHER): Admitting: Obstetrics & Gynecology

## 2024-02-11 VITALS — BP 109/74 | HR 76 | Ht 65.0 in | Wt 202.8 lb

## 2024-02-11 DIAGNOSIS — R102 Pelvic and perineal pain: Secondary | ICD-10-CM

## 2024-02-11 LAB — MYCOPLASMA / UREAPLASMA CULTURE: Ureaplasma urealyticum: POSITIVE — AB

## 2024-02-11 NOTE — Progress Notes (Signed)
    GYNECOLOGY PROGRESS NOTE  Subjective:    Patient ID: Alexis Wilcox, female    DOB: April 21, 2006, 18 y.o.   MRN: [id number]  HPI  Patient is a 18 y.o. single G1P0A1 here as a new patient for evaluation of pelvic pain. This pain first started about 06/2022, almost 2 years. She had a normal pelvic ultrasound 07/2023. She uses Nexplanon , placed 03/2022. She does not have periods. The pain occurs randomly, not related to  walking, having sex, although the pain can occur randomly any time.  She notices that the pain is better when she is not eating. The pain can last only a few seconds to several minutes or even the whole day. It will spontaneously resolves. She has not tried IBU or tylenol . She has a BM daily. When asked to point to the area of pain, she points to the entire lower abdomen. She has negative STI testing. She has been monogamous for about a year.   She sometimes feels like she has a UTI, has frequent polyuria, urgency, but reports that her tests are negative.  The following portions of the patient's history were reviewed and updated as appropriate: allergies, current medications, past family history, past medical history, past social history, past surgical history, and problem list.  Review of Systems Pertinent items are noted in HPI.   Objective:   Blood pressure 109/74, pulse 76, height 5' 5 (1.651 m), weight 202 lb 12.8 oz (92 kg). Body mass index is 33.75 kg/m. Well nourished, well hydrated [place], no apparent distress  normal size and shape, anteverted uterus, normal adnexal exam, no pain with exam    Assessment:  Pelvic pain in a female I don't think that this is from a gyn etiology. I think that IC is a possible cause of her pain.  Plan:   We have discussed IC, triggers I have rec'd IC diet. I have referred her to urology.

## 2024-02-26 NOTE — Telephone Encounter (Signed)
 pt lft mess on my vmail and I called back and lft on her vmail.

## 2024-02-28 ENCOUNTER — Encounter: Payer: Self-pay | Admitting: Podiatry

## 2024-02-28 NOTE — Telephone Encounter (Signed)
 pt lft mess on my vmail and I called her bk. She is still having pain and needs RTW revised 04/04/24 approx. faxing note to [facility] but told her if forms recd she wil have to pay 25 before I complete

## 2024-03-03 NOTE — Telephone Encounter (Signed)
 cld and lft mess to go and to [employer] office and pay 25 fee for forms and she can leave and they will fax or she can email them. Nothing recd via fax

## 2024-03-04 DIAGNOSIS — Z0279 Encounter for issue of other medical certificate: Secondary | ICD-10-CM

## 2024-03-04 NOTE — Telephone Encounter (Signed)
 Pt paid 25.00 for forms. I faxed [phone] forms and note

## 2024-03-05 ENCOUNTER — Ambulatory Visit (INDEPENDENT_AMBULATORY_CARE_PROVIDER_SITE_OTHER): Admitting: Physician Assistant

## 2024-03-05 ENCOUNTER — Ambulatory Visit
Admission: RE | Admit: 2024-03-05 | Discharge: 2024-03-05 | Disposition: A | Discharge status: Home / Self Care | Source: Ambulatory Visit | Attending: Physician Assistant | Admitting: Physician Assistant

## 2024-03-05 VITALS — BP 139/89 | HR 105 | Ht 65.0 in | Wt 201.0 lb

## 2024-03-05 DIAGNOSIS — R10A0 Flank pain, unspecified side: Secondary | ICD-10-CM | POA: Diagnosis not present

## 2024-03-05 DIAGNOSIS — L292 Pruritus vulvae: Secondary | ICD-10-CM | POA: Diagnosis not present

## 2024-03-05 DIAGNOSIS — R102 Pelvic and perineal pain unspecified side: Secondary | ICD-10-CM | POA: Diagnosis not present

## 2024-03-05 DIAGNOSIS — R109 Unspecified abdominal pain: Secondary | ICD-10-CM | POA: Insufficient documentation

## 2024-03-05 DIAGNOSIS — K921 Melena: Secondary | ICD-10-CM

## 2024-03-05 DIAGNOSIS — Z7251 High risk heterosexual behavior: Secondary | ICD-10-CM | POA: Diagnosis not present

## 2024-03-05 LAB — MICROSCOPIC EXAMINATION: Epithelial Cells (non renal): >10 /HPF — AB (ref 0–10)

## 2024-03-05 LAB — URINALYSIS, COMPLETE
Bilirubin, UA: NEGATIVE
Glucose, UA: NEGATIVE
Ketones, UA: NEGATIVE
Nitrite, UA: NEGATIVE
Protein,UA: NEGATIVE
RBC, UA: NEGATIVE
Specific Gravity, UA: 1.02 (ref 1.005–1.030)
Urobilinogen, Ur: 0.2 mg/dL (ref 0.2–1.0)
pH, UA: 6.5 (ref 5.0–7.5)

## 2024-03-05 NOTE — Progress Notes (Signed)
 03/05/2024 12:21 PM   [name] 07/10/2005 [id number]  CC: Chief Complaint  Patient presents with   Other   HPI: Alexis Wilcox is a 18 y.o. female with PMH pelvic pain who presents today for follow-up.   I saw her most recently in clinic 1 month ago.  Atypical culture was positive for Ureaplasma and I started her on doxycycline  100 mg twice daily x 7 days.  She followed up with OB/GYN as I asked, who felt that her pain was not gynecologic in origin.  Today she reports she completed the doxycycline  with no noticeable change in her pelvic pain.  She describes some occasional urinary urgency and frequency, though these are not particularly bothersome right now.  Notably, she describes a 91-month history of intermittent constipation, which is new for her.  2 weeks ago she saw bright red blood on her stool.  Since then, she has had nausea and difficulty keeping food down as well as pelvic cramping.  She had a negative UPT about 2 weeks ago.  In-office UA today positive for trace leukocytes; urine microscopy with 6-10 WBCs/HPF and>10 epithelial cells/hpf.  PMH: Past Medical History:  Diagnosis Date   Asthma    Depression     Surgical History: Past Surgical History:  Procedure Laterality Date   ANKLE SURGERY Left 08/19/2023   NO PAST SURGERIES      Home Medications:  Allergies as of 03/05/2024   No Known Allergies      Medication List        Accurate as of March 05, 2024 12:21 PM. If you have any questions, ask your nurse or doctor.          NEXPLANON   Inject into the skin.        Allergies:  No Known Allergies  Family History: Family History  Problem Relation Age of Onset   Diabetes Mother    Diabetes Maternal Grandmother     Social History:   reports that she has never smoked. She has never used smokeless tobacco. She reports current alcohol use. She reports current drug use. Frequency: 4.00 times per week. Drug:  Marijuana.  Physical Exam: BP 139/89   Pulse (!) 105   Ht 5' 5 (1.651 m)   Wt 201 lb (91.2 kg)   BMI 33.45 kg/m   Constitutional:  Alert and oriented, no acute distress, nontoxic appearing HEENT: [place], AT Cardiovascular: No clubbing, cyanosis, or edema Respiratory: Normal respiratory effort, no increased work of breathing Skin: No rashes, bruises or suspicious lesions Neurologic: Grossly intact, no focal deficits, moving all 4 extremities Psychiatric: Normal mood and affect  Laboratory Data: Results for orders placed or performed in visit on 03/05/24  Microscopic Examination   Collection Time: 03/05/24 11:21 AM   Urine  Result Value Ref Range   WBC, UA 6-10 (A) 0 - 5 /hpf   RBC, Urine 0-2 0 - 2 /hpf   Epithelial Cells (non renal) >10 (A) 0 - 10 /hpf   Crystals Present (A) N/A   Crystal Type Amorphous Sediment N/A   Mucus, UA Present (A) Not Estab.   Bacteria, UA Few None seen/Few  Urinalysis, Complete   Collection Time: 03/05/24 11:21 AM  Result Value Ref Range   Specific Gravity, UA 1.020 1.005 - 1.030   pH, UA 6.5 5.0 - 7.5   Color, UA Yellow Yellow   Appearance Ur Clear Clear   Leukocytes,UA Trace (A) Negative   Protein,UA Negative Negative/Trace  Glucose, UA Negative Negative   Ketones, UA Negative Negative   RBC, UA Negative Negative   Bilirubin, UA Negative Negative   Urobilinogen, Ur 0.2 0.2 - 1.0 mg/dL   Nitrite, UA Negative Negative   Microscopic Examination See below:    Assessment & Plan:   1. Pelvic pain in female (Primary) Chronic pelvic pain, GYN feels not gynecologic in origin.  With 2 weeks of pelvic pain and cramping associated with nausea, will obtain serum hCG today to rule out pregnancy.  She had no significant improvement in her pelvic pain with doxycycline .  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  That said, I will repeat an atypical culture  today.  We discussed the differential for her pelvic pain does include IC, however she denies dysuria and has no relief in her symptoms with voiding.  I think this is a rather atypical presentation for that.  If her pregnancy test is negative, we will pursue a 1 month trial of low-dose amitriptyline to see if this gives her any improvement. - Urinalysis, Complete - Mycoplasma / ureaplasma culture - hCG, serum, qualitative  2. Hematochezia New intermittent constipation x 6 months and reports of hematochezia 2 weeks ago.  I question chronic constipation of is the source of her pelvic pain.  I would like her to see GI as well. - Ambulatory referral to Gastroenterology  Return for Will call with results.  Lucie Hones, PA-C  [facility] [address] [place], [place] [address] [phone]

## 2024-03-05 NOTE — Telephone Encounter (Signed)
 Sent mess via MyChart also to let her know it was faxed on yesterday and confirmation sheet was recd

## 2024-03-06 LAB — HCG, SERUM, QUALITATIVE: hCG,Beta Subunit,Qual,Serum: NEGATIVE m[IU]/mL (ref <6)

## 2024-03-10 NOTE — Telephone Encounter (Signed)
 I emailed pt what was faxed to [facility] on 03/04/24 and used the same fax# that is on their form and I have always used. She sent note that they are saying they didn't get. I recd confirmation sheet the fax went thru.

## 2024-03-12 ENCOUNTER — Ambulatory Visit: Payer: Self-pay | Admitting: Physician Assistant

## 2024-03-12 LAB — MYCOPLASMA / UREAPLASMA CULTURE

## 2024-03-31 ENCOUNTER — Encounter: Admitting: Obstetrics & Gynecology

## 2024-04-08 ENCOUNTER — Encounter: Payer: Self-pay | Admitting: Obstetrics & Gynecology

## 2024-04-14 ENCOUNTER — Ambulatory Visit (INDEPENDENT_AMBULATORY_CARE_PROVIDER_SITE_OTHER): Admitting: Podiatry

## 2024-04-14 DIAGNOSIS — M62462 Contracture of muscle, left lower leg: Secondary | ICD-10-CM | POA: Diagnosis not present

## 2024-04-14 DIAGNOSIS — M722 Plantar fascial fibromatosis: Secondary | ICD-10-CM

## 2024-04-14 NOTE — Progress Notes (Signed)
  Subjective:  Patient ID: Alexis Wilcox, female    DOB: [birth year],  MRN: [id number]  Chief Complaint  Patient presents with   Foot Pain    Heel pain    18 y.o. female presents with the above complaint.  Patient presents with new complaint of left heel pain that has came out of nowhere.  She wanted to get it evaluated hurts with ambulation and shoe pressure has not seen MRIs prior to seeing me denies any other acute complaints would like to discuss treatment options for this.   Review of Systems: Negative except as noted in the HPI. Denies N/V/F/Ch.  Past Medical History:  Diagnosis Date   Asthma    Depression     Current Outpatient Medications:    Etonogestrel  (NEXPLANON  ), Inject into the skin., Disp: , Rfl:   Social History   Tobacco Use  Smoking Status Never  Smokeless Tobacco Never    No Known Allergies Objective:  There were no vitals filed for this visit. There is no height or weight on file to calculate BMI. Constitutional Well developed. Well nourished.  Vascular Dorsalis pedis pulses palpable bilaterally. Posterior tibial pulses palpable bilaterally. Capillary refill normal to all digits.  No cyanosis or clubbing noted. Pedal hair growth normal.  Neurologic Normal speech. Oriented to person, place, and time. Epicritic sensation to light touch grossly present bilaterally.  Dermatologic Nails well groomed and normal in appearance. No open wounds. No skin lesions.  Orthopedic: Normal joint ROM without pain or crepitus bilaterally. No visible deformities. Tender to palpation at the calcaneal tuber left. No pain with calcaneal squeeze left. Ankle ROM diminished range of motion left. Silfverskiold Test: positive left.   Radiographs: None  Assessment:   1. Plantar fasciitis of left foot   2. Gastrocnemius equinus, left    Plan:  Patient was evaluated and treated and all questions answered.  Plantar Fasciitis, left with underlying  gastrocnemius equinus - XR reviewed as above.  - Educated on icing and stretching. Instructions given.  - Injection delivered to the plantar fascia as below. - DME: Plantar fascial brace dispensed to support the medial longitudinal arch of the foot and offload pressure from the heel and prevent arch collapse during weightbearing - Pharmacologic management: None  Procedure: Injection Tendon/Ligament Location: Left plantar fascia at the glabrous junction; medial approach. Skin Prep: alcohol Injectate: 0.5 cc 0.5% marcaine plain, 0.5 cc of 1% Lidocaine, 0.5 cc kenalog 10. Disposition: Patient tolerated procedure well. Injection site dressed with a band-aid.  No follow-ups on file.

## 2024-04-17 ENCOUNTER — Telehealth: Payer: Self-pay | Admitting: Podiatry

## 2024-04-17 ENCOUNTER — Encounter: Payer: Self-pay | Admitting: Podiatry

## 2024-04-17 NOTE — Telephone Encounter (Signed)
 s/w pt and she needs work note. RTW 04/20/24 with restric/accom - no overtime and breaks every hr(per 8hr) for to elevate foot. fax [employer] [phone] and email her.I adv her if they need forms completed, she will have to pay 25

## 2024-04-20 ENCOUNTER — Encounter: Payer: Self-pay | Admitting: Podiatry

## 2024-04-20 ENCOUNTER — Telehealth: Payer: Self-pay | Admitting: Podiatry

## 2024-04-20 NOTE — Telephone Encounter (Signed)
 cld pt bk and she confirmed she needs end date for restr/accomm.I did the note again with end date 08/02/2024. I emailed to pt

## 2024-04-23 ENCOUNTER — Telehealth: Payer: Self-pay | Admitting: Podiatry

## 2024-04-23 NOTE — Telephone Encounter (Signed)
 pt lft mess to say her job will not allow her accomm till next year. I called her back and lft mess for her to confirm if 06/03/24 is good. This will be the last letter we can do and not charge her $25- see prev notes/# of notes that have been done.

## 2024-04-26 ENCOUNTER — Emergency Department

## 2024-04-26 ENCOUNTER — Encounter: Payer: Self-pay | Admitting: Intensive Care

## 2024-04-26 ENCOUNTER — Emergency Department
Admission: EM | Admit: 2024-04-26 | Discharge: 2024-04-26 | Disposition: A | Discharge status: Home / Self Care | Attending: Emergency Medicine | Admitting: Emergency Medicine

## 2024-04-26 ENCOUNTER — Other Ambulatory Visit: Payer: Self-pay

## 2024-04-26 DIAGNOSIS — G8911 Acute pain due to trauma: Secondary | ICD-10-CM | POA: Insufficient documentation

## 2024-04-26 DIAGNOSIS — M25512 Pain in left shoulder: Secondary | ICD-10-CM | POA: Insufficient documentation

## 2024-04-26 DIAGNOSIS — W010XXA Fall on same level from slipping, tripping and stumbling without subsequent striking against object, initial encounter: Secondary | ICD-10-CM | POA: Insufficient documentation

## 2024-04-26 DIAGNOSIS — M25572 Pain in left ankle and joints of left foot: Secondary | ICD-10-CM | POA: Diagnosis present

## 2024-04-26 MED ORDER — ACETAMINOPHEN 325 MG PO TABS
650.0000 mg | ORAL_TABLET | Freq: Once | ORAL | Status: DC
Start: 2024-04-26 — End: 2024-04-26
  Filled 2024-04-26: qty 2

## 2024-04-26 MED ORDER — KETOROLAC TROMETHAMINE 15 MG/ML IJ SOLN
15.0000 mg | Freq: Once | INTRAMUSCULAR | Status: AC
  Administered 2024-04-26: 15 mg via INTRAMUSCULAR
  Filled 2024-04-26: qty 1

## 2024-04-26 MED ORDER — KETOROLAC TROMETHAMINE 15 MG/ML IJ SOLN
15.0000 mg | Freq: Once | INTRAMUSCULAR | Status: DC
Start: 2024-04-26 — End: 2024-04-26
  Filled 2024-04-26: qty 1

## 2024-04-26 NOTE — ED Triage Notes (Signed)
 Patient reports having ankle surgery in March 2025 and the healing is not going well. Reports having two falls today and chest discomfort from falling on chest.

## 2024-04-26 NOTE — Discharge Instructions (Addendum)
 Please follow-up with your podiatrist.  Rest, ice, elevate your foot.  Please return for any new, worsening, or change in symptoms or other concerns.  It was a pleasure caring for you today.

## 2024-04-26 NOTE — ED Notes (Signed)
 Prior to discharge the pt advised she was still in a lot and wanted to know if we could do anything else. The provider was notified.The pt advised that we had not done sh*t for her and that is why she hates this f**king hospital. The pt was wheeled out to her families vehicle without incident.

## 2024-04-26 NOTE — ED Provider Notes (Signed)
 [facility] Provider Note    Event Date/Time   First MD Initiated Contact with Patient 04/26/24 0827     (approximate)   History   Ankle Pain   HPI  Alexis Wilcox is a 18 y.o. 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  Patient reports that she got up to use the bathroom the middle of the night and tripped and twisted her ankle that she had surgery on, and then landed on her left shoulder.  She denies head strike or LOC.  She reports that she has pain in her ankle and in her shoulder.  She reports that she has had pain with weightbearing ever since.  She does not use a boot at home.  No numbness or tingling.  No fevers or chills.     Patient Active Problem List   Diagnosis Date Noted   Nexplanon  in place 01/18/2023          Physical Exam   Triage Vital Signs: ED Triage Vitals  Encounter Vitals Group     BP 04/26/24 0745 129/86     Girls Systolic BP Percentile --      Girls Diastolic BP Percentile --      Boys Systolic BP Percentile --      Boys Diastolic BP Percentile --      Pulse Rate 04/26/24 0745 99     Resp 04/26/24 0745 16     Temp 04/26/24 0745 99 F (37.2 C)     Temp Source 04/26/24 0745 Oral     SpO2 04/26/24 0745 99 %     Weight 04/26/24 0746 200 lb (90.7 kg)     Height 04/26/24 0746 5' 5 (1.651 m)     Head Circumference --      Peak Flow --      Pain Score 04/26/24 0746 10     Pain Loc --      Pain Education --      Exclude from Growth Chart --     Most recent vital signs: Vitals:   04/26/24 0745 04/26/24 0821  BP: 129/86   Pulse: 99   Resp: 16   Temp: 99 F (37.2 C)   SpO2: 99% 99%    Physical Exam Vitals and nursing note reviewed.  Constitutional:      General: Awake and alert. No acute distress.    Appearance: Normal appearance. The patient is  normal weight.  HENT:     Head: Normocephalic and atraumatic.     Mouth: Mucous membranes are moist.  Eyes:     General: PERRL. Normal EOMs        Right eye: No discharge.        Left eye: No discharge.     Conjunctiva/sclera: Conjunctivae normal.  Cardiovascular:     Rate and Rhythm: Normal rate and regular rhythm.     Pulses: Normal pulses.  Pulmonary:     Effort: Pulmonary effort is normal. No respiratory distress.     Breath sounds: Normal breath sounds.  Abdominal:     Abdomen is soft. There is no abdominal tenderness. No rebound or guarding. No distention. Musculoskeletal:        General: No swelling. Normal range of motion.     Cervical back: Normal range of motion and neck supple.  Left ankle: Well-healed surgical incision noted  over the medial aspect of the ankle.  No warmth or erythema noted.  Mild swelling noted over the left lateral malleolus.  Able to plantarflex and dorsiflex against resistance.  Normal pedal pulse.  Foot is warm and well-perfused and equal temperature to opposite.  Normal-appearing knee and hip. Left shoulder: Pain with forward flexion and abduction past 90 degrees.  Negative drop arm test.  Normal radial pulse.  No deformity noted.  No clavicular or AC joint tenderness.  Tenderness palpation to anterior shoulder joint line. Skin:    General: Skin is warm and dry.     Capillary Refill: Capillary refill takes less than 2 seconds.     Findings: No rash.  Neurological:     Mental Status: The patient is awake and alert.      ED Results / Procedures / Treatments   Labs (all labs ordered are listed, but only abnormal results are displayed) Labs Reviewed - No data to display   EKG     RADIOLOGY I independently reviewed and interpreted imaging and agree with radiologists findings.     PROCEDURES:  Critical Care performed:   Procedures   MEDICATIONS ORDERED IN ED: Medications  ketorolac  (TORADOL ) 15 MG/ML injection 15 mg (15 mg  Intramuscular Given 04/26/24 1027)     IMPRESSION / MDM / ASSESSMENT AND PLAN / ED COURSE  I reviewed the triage vital signs and the nursing notes.   Differential diagnosis includes, but is not limited to, sprain, fracture, contusion, rotator cuff injury.  Patient had an ankle x-ray and a chest x-ray in triage which were normal.  Patient has no pain in her chest on my exam, it is her left shoulder, and therefore left shoulder x-ray was added on.  She is requesting pain medication for her ankle, denies chance of pregnancy and does not wish to take a pregnancy test prior to medications.  She understands that the medications provide it may be very dangerous to her developing fetus, in which she reports that she does not wish to take a pregnancy test but does wish to have the medications.  Patient had originally declined the medications prescribed but then agreed to Toradol .  She was given a boot for protection and instructed to follow-up with her podiatrist.  There is no warmth, erythema, or constitutional symptoms to suggest infection at this time.  There is no skin injury.  We discussed return precautions and outpatient follow-up.  Also recommend rest, ice, elevation.  The patient is requesting something stronger and using profanities with the RN.  I considered narcotics, however I feel that the risks of initiating narcotic therapy in a young person for what appears to be contusion versus sprain versus musculoskeletal injury is risky from an addiction standpoint and I explained this to the patient. I feel potential harms outweigh potential benefit.  She has had 2 previous narcotic prescriptions per PDMP.  She demonstrated understanding.  She is willing to try Tylenol /ibuprofen  and call her podiatrist in the morning when the office opens.  However, we did discuss very strict ER return precautions for worsening pain that would require potential narcotic therapy or further workup as indicated.  Patient  understands and agrees with plan.  She was discharged in stable condition with family member.  Patient's presentation is most consistent with acute complicated illness / injury requiring diagnostic workup.   Clinical Course as of 04/27/24 9141  [place] Apr 26, 2024  9056 Patient is refusing the ordered Tylenol  and Toradol .  Requesting something  stronger. [JP]  1018 Patient is now requesting the Toradol , this was reordered [JP]    Clinical Course User Index [JP] Linea Calles E, PA-C     FINAL CLINICAL IMPRESSION(S) / ED DIAGNOSES   Final diagnoses:  Acute left ankle pain  Acute pain of left shoulder     Rx / DC Orders   ED Discharge Orders     None        Note:  This document was prepared using Dragon voice recognition software and may include unintentional dictation errors.   Mildreth Reek E, PA-C 04/27/24 9141    Bradler, Evan K, MD 05/02/24 [phone]

## 2024-04-27 ENCOUNTER — Ambulatory Visit (INDEPENDENT_AMBULATORY_CARE_PROVIDER_SITE_OTHER)

## 2024-04-27 ENCOUNTER — Encounter: Payer: Self-pay | Admitting: Podiatry

## 2024-04-27 ENCOUNTER — Ambulatory Visit (INDEPENDENT_AMBULATORY_CARE_PROVIDER_SITE_OTHER): Admitting: Podiatry

## 2024-04-27 DIAGNOSIS — S92325A Nondisplaced fracture of second metatarsal bone, left foot, initial encounter for closed fracture: Secondary | ICD-10-CM | POA: Diagnosis not present

## 2024-04-27 DIAGNOSIS — S93622A Sprain of tarsometatarsal ligament of left foot, initial encounter: Secondary | ICD-10-CM | POA: Diagnosis not present

## 2024-04-27 DIAGNOSIS — S9032XA Contusion of left foot, initial encounter: Secondary | ICD-10-CM | POA: Diagnosis not present

## 2024-04-27 MED ORDER — IBUPROFEN 800 MG PO TABS: 800.0000 mg | ORAL_TABLET | Freq: Three times a day (TID) | ORAL | 0 refills | Status: AC | PRN

## 2024-04-27 MED ORDER — OXYCODONE-ACETAMINOPHEN 5-325 MG PO TABS
1.0000 | ORAL_TABLET | Freq: Three times a day (TID) | ORAL | 0 refills | Status: AC | PRN
Start: 2024-04-27 — End: 2024-05-02

## 2024-04-27 NOTE — Patient Instructions (Signed)
Call [facility] to schedule your CT at the below locations.  Please allow at least 1 business day after your visit to process the referral.  It may take longer depending on approval from insurance.  Please let me know if you have issues or problems scheduling the CT   [facility] [phone] [address] [place],  [address]  [facility] [place] [phone] [address] [place],  [address]  

## 2024-04-28 ENCOUNTER — Telehealth: Payer: Self-pay | Admitting: Podiatry

## 2024-04-28 NOTE — Progress Notes (Deleted)
 04/28/2024 Alexis Wilcox [id number] 13-Jun-2005  Gastroenterology Office Note    Referring Provider: Maurine Lukes,* Primary Care Physician:  [facility]  Primary GI Provider: Jinny Carmine, MD    Chief Complaint   No chief complaint on file.    History of Present Illness   Alexis Wilcox is a 18 y.o. female with PMHX of *** , presenting today at the request of Vaillancourt, Lukes,* due to hematochezia.    Patien seen By urology on 03/05/2024 for pelvic pain, also with complaints of  intermittent constipation for 6 month and hematochezia.   Past Medical History:  Diagnosis Date   Asthma    Depression     Past Surgical History:  Procedure Laterality Date   ANKLE SURGERY Left 08/19/2023   NO PAST SURGERIES      Current Outpatient Medications  Medication Sig Dispense Refill   Etonogestrel  (NEXPLANON  [place]) Inject into the skin.     ibuprofen  (IBU) 800 MG tablet Take 1 tablet (800 mg total) by mouth every 8 (eight) hours as needed for up to 20 days. 60 tablet 0   oxyCODONE -acetaminophen  (PERCOCET/ROXICET) 5-325 MG tablet Take 1 tablet by mouth every 8 (eight) hours as needed for up to 5 days for severe pain (pain score 7-10). 15 tablet 0   No current facility-administered medications for this visit.    Allergies as of 04/29/2024   (No Known Allergies)    Family History  Problem Relation Age of Onset   Diabetes Mother    Diabetes Maternal Grandmother     Social History   Socioeconomic History   Marital status: Single    Spouse name: Not on file   Number of children: Not on file   Years of education: Not on file   Highest education level: Not on file  Occupational History   Not on file  Tobacco Use   Smoking status: Never   Smokeless tobacco: Never  Vaping Use   Vaping status: Some Days  Substance and Sexual Activity   Alcohol use: Yes    Comment: Socially   Drug use: Yes    Frequency: 4.0 times  per week    Types: Marijuana    Comment: every other day   Sexual activity: Yes    Partners: Male    Birth control/protection: Implant    Comment: nexplanon   Other Topics Concern   Not on file  Social History Narrative   Not on file   Social Drivers of Health   Financial Resource Strain: Not on file  Food Insecurity: Not on file  Transportation Needs: Not on file  Physical Activity: Not on file  Stress: Not on file  Social Connections: Not on file  Intimate Partner Violence: Not At Risk (07/25/2022)   Humiliation, Afraid, Rape, and Kick questionnaire    Fear of Current or Ex-Partner: No    Emotionally Abused: No    Physically Abused: No    Sexually Abused: No     RELEVANT GI HISTORY, IMAGING AND LABS: CBC    Component Value Date/Time   WBC 10.5 08/12/2023 1939   RBC 4.68 08/12/2023 1939   HGB 13.1 08/12/2023 1939   HCT 39.9 08/12/2023 1939   PLT 365 08/12/2023 1939   MCV 85.3 08/12/2023 1939   MCH 28.0 08/12/2023 1939   MCHC 32.8 08/12/2023 1939   RDW 12.4 08/12/2023 1939   LYMPHSABS 2.4 01/25/2018 1105   MONOABS 0.5 01/25/2018 1105   EOSABS 0.2 01/25/2018  1105   BASOSABS 0.1 01/25/2018 1105   Recent Labs    07/15/23 1144 08/12/23 1939  HGB 13.4 13.1    CMP     Component Value Date/Time   NA 142 08/12/2023 1939   NA 140 09/05/2013 1004   K 3.5 08/12/2023 1939   K 3.9 09/05/2013 1004   CL 106 08/12/2023 1939   CL 108 (H) 09/05/2013 1004   CO2 25 08/12/2023 1939   CO2 27 (H) 09/05/2013 1004   GLUCOSE 93 08/12/2023 1939   GLUCOSE 91 09/05/2013 1004   BUN 17 08/12/2023 1939   BUN 9 09/05/2013 1004   CREATININE 0.59 08/12/2023 1939   CREATININE 0.52 09/05/2013 1004   CALCIUM 9.6 08/12/2023 1939   CALCIUM 9.2 09/05/2013 1004   PROT 7.7 08/12/2023 1939   PROT 7.6 09/05/2013 1004   ALBUMIN 4.6 08/12/2023 1939   ALBUMIN 4.0 09/05/2013 1004   AST 16 08/12/2023 1939   AST 17 09/05/2013 1004   ALT 15 08/12/2023 1939   ALT 25 09/05/2013 1004    ALKPHOS 68 08/12/2023 1939   ALKPHOS 409 (H) 09/05/2013 1004   BILITOT 0.7 08/12/2023 1939   BILITOT 0.3 09/05/2013 1004   GFRNONAA >60 08/12/2023 1939   GFRAA NOT CALCULATED 03/12/2018 1149      Latest Ref Rng & Units 08/12/2023    7:39 PM 07/15/2023   11:44 AM 12/31/2022    3:06 PM  Hepatic Function  Total Protein 6.5 - 8.1 g/dL 7.7  7.3  7.8   Albumin 3.5 - 5.0 g/dL 4.6  4.4  4.7   AST 15 - 41 U/L 16  14  14    ALT 0 - 44 U/L 15  15  15    Alk Phosphatase 38 - 126 U/L 68  68  69   Total Bilirubin 0.0 - 1.2 mg/dL 0.7  0.6  0.5       Review of Systems   All systems reviewed and negative except where noted in HPI.    Physical Exam  There were no vitals taken for this visit. No LMP recorded. Patient has had an implant. General:   Alert and oriented. Pleasant and cooperative. Well-nourished and well-developed.  Head:  Normocephalic and atraumatic. Eyes:  Without icterus Ears:  Normal auditory acuity. Neck:  Supple; no masses or thyromegaly. Lungs:  Respirations even and unlabored.  Clear throughout to auscultation.   No wheezes, crackles, or rhonchi. No acute distress. Heart:  Regular rate and rhythm; no murmurs, clicks, rubs, or gallops. Abdomen:  Normal bowel sounds.  No bruits.  Soft, non-tender and non-distended without masses, hepatosplenomegaly or hernias noted.  No guarding or rebound tenderness.  ***Negative Carnett sign.   Rectal:  Deferred.***  Msk:  Symmetrical without gross deformities. Normal posture. Extremities:  Without edema. Neurologic:  Alert and  oriented x4;  grossly normal neurologically. Skin:  Intact without significant lesions or rashes. Psych:  Alert and cooperative. Normal mood and affect.   Assessment & Plan   Alexis Wilcox is a 18 y.o. female presenting today with    I discussed the assessment and treatment plan with the patient. The patient was provided an opportunity to ask questions and all were answered. The patient agreed with  the plan and demonstrated an understanding of the instructions.   The patient was advised to call back or seek an in-person evaluation if the symptoms worsen or if the condition fails to improve as anticipated.  Grayce Bohr, DNP, AGNP-C [place]  [facility]
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
## 2024-04-29 ENCOUNTER — Ambulatory Visit
Admission: RE | Admit: 2024-04-29 | Discharge: 2024-04-29 | Disposition: A | Discharge status: Home / Self Care | Source: Ambulatory Visit | Attending: Podiatry | Admitting: Podiatry

## 2024-04-29 ENCOUNTER — Ambulatory Visit: Admitting: Family Medicine

## 2024-04-29 ENCOUNTER — Other Ambulatory Visit: Payer: Self-pay | Admitting: Podiatry

## 2024-04-29 ENCOUNTER — Ambulatory Visit: Payer: Self-pay | Admitting: Podiatry

## 2024-04-29 DIAGNOSIS — S92325A Nondisplaced fracture of second metatarsal bone, left foot, initial encounter for closed fracture: Secondary | ICD-10-CM

## 2024-04-29 DIAGNOSIS — S93622A Sprain of tarsometatarsal ligament of left foot, initial encounter: Secondary | ICD-10-CM

## 2024-05-12 ENCOUNTER — Encounter: Payer: Self-pay | Admitting: Podiatry

## 2024-05-12 ENCOUNTER — Ambulatory Visit: Admitting: Podiatry

## 2024-05-12 DIAGNOSIS — S93622A Sprain of tarsometatarsal ligament of left foot, initial encounter: Secondary | ICD-10-CM
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
## 2024-05-20 ENCOUNTER — Telehealth: Payer: Self-pay | Admitting: Podiatry

## 2024-05-20 NOTE — Telephone Encounter (Signed)
 cld pt bk from mess lft and adv her of forms for [facility]. I faxed [phone] and emailed her copy of what is faxed for her records. She already paid fee

## 2024-06-08 ENCOUNTER — Encounter: Payer: Self-pay | Admitting: Podiatry

## 2024-06-08 ENCOUNTER — Telehealth: Payer: Self-pay | Admitting: Podiatry

## 2024-06-08 NOTE — Telephone Encounter (Signed)
 Pt lft mess requesting RTW note for 06/15/24 with break accom every 2hrs -15mins for 1 week. 06/22/24 she can return with no restrictions. She wants it emailed.

## 2024-06-09 ENCOUNTER — Encounter: Payer: Self-pay | Admitting: Podiatry

## 2024-06-09 ENCOUNTER — Telehealth: Payer: Self-pay | Admitting: Podiatry

## 2024-06-09 NOTE — Telephone Encounter (Signed)
 Pt lft mess again that she wants now with no restrictions. She still needs to go bk to work 06/15/24 and to email her again.

## 2024-06-23 ENCOUNTER — Ambulatory Visit: Admitting: Podiatry

## 2024-06-23 DIAGNOSIS — S93622A Sprain of tarsometatarsal ligament of left foot, initial encounter: Secondary | ICD-10-CM | POA: Diagnosis not present

## 2024-06-23 NOTE — Progress Notes (Unsigned)
 Doing much better has transitioned to regular shoes.  If any foot and ankle issues are future she will come back and see me.  She is breaking in orthotics as well
